# Patient Record
Sex: Female | Born: 1970 | Hispanic: Yes | Marital: Married | Smoking: Never smoker
Health system: Southern US, Community
[De-identification: ages and names within clinical notes are randomized; demographics above are authoritative.]

## PROBLEM LIST (undated history)

## (undated) DIAGNOSIS — D259 Leiomyoma of uterus, unspecified: Secondary | ICD-10-CM

## (undated) HISTORY — DX: Leiomyoma of uterus, unspecified: D25.9

## (undated) HISTORY — PX: TUBAL LIGATION: SHX77

---

## 2017-08-16 ENCOUNTER — Encounter: Payer: Self-pay | Admitting: Obstetrics and Gynecology

## 2017-08-16 ENCOUNTER — Ambulatory Visit (INDEPENDENT_AMBULATORY_CARE_PROVIDER_SITE_OTHER): Payer: Self-pay | Admitting: Obstetrics and Gynecology

## 2017-08-16 DIAGNOSIS — D219 Benign neoplasm of connective and other soft tissue, unspecified: Secondary | ICD-10-CM | POA: Insufficient documentation

## 2017-08-16 DIAGNOSIS — N920 Excessive and frequent menstruation with regular cycle: Secondary | ICD-10-CM | POA: Insufficient documentation

## 2017-08-16 MED ORDER — NORGESTIMATE-ETH ESTRADIOL 0.25-35 MG-MCG PO TABS: 1.0000 | ORAL_TABLET | Freq: Every day | ORAL | 11 refills | Status: DC

## 2017-08-16 NOTE — Progress Notes (Signed)
Patient ID: Ruth Perkins, female   DOB: 05-20-1970, 47 y.o.   MRN: [id number] Ms Ruth Perkins is referred by [facility] for evaluation for heavy cycles and uterine fibroids. Pt reports Dx with uterine fibroids 3 yrs ago in [place]. Cycles have been heavy and occurring 2 times a month until last 3 months. Was started on OCP's at [facility] 3 months ago. Cycles are now monthly and not as heavy Pap smear and Mammogram UTD C Section x 3 BTL No chronic medical problems  PE AF VSS Lungs clear Heart RRR Abd soft + BS GU Nl EGBUS uterus 12 week size non tender mobile no masses   A/P Uterine fibroids        Menorrhagia  Dx reviewed with pt. Will continue with OCP's. Pt has no insurance. Financial aid package provided to pt. Will order GYN U/S once approved for financial aid. If not approved will continue with OCP's. Also discusses possible IUD. Pt reports used IUD in [place] and did well. Pt informed if unable to get U/S she can follow up with [facility] for her OCP's of IUD placement F/U per above of PRN Interrupter used during today's visit

## 2017-08-16 NOTE — Progress Notes (Signed)
[place]

## 2017-08-16 NOTE — Patient Instructions (Signed)
Fibromas uterinos (Uterine Fibroids) Los fibromas uterinos son masas (tumores) de tejido que pueden desarrollarse en el vientre (tero). Tambin se los conoce como liomiomas. Este tipo de tumor no es canceroso (benigno) y no se disemina a otras partes del cuerpo fuera de la zona plvica, la cual se encuentra entre los huesos de la cadera. En ocasiones, los fibromas pueden crecer en las trompas de [place], en el cuello del tero o en las estructuras de soporte (ligamentos) que rodean el tero. Una mujer puede tener uno o ms fibromas. Los fibromas pueden tener diferente tamao y peso, y crecer en distintas partes del tero. Algunos pueden crecer hasta volverse bastante grandes. La mayora no requiere tratamiento mdico. CAUSAS Un fibroma puede desarrollarse cuando una nica clula uterina contina creciendo (se multiplica). La mayora de las clulas del cuerpo humano tienen un mecanismo de control que impide que se multipliquen sin control. SIGNOS Y SNTOMAS Entre los sntomas se pueden incluir los siguientes:  Hemorragias intensas durante la menstruacin.  Prdidas de sangre o hemorragias entre los perodos.  Dolor y opresin en la pelvis.  Problemas de la vejiga, como necesidad de orinar con ms frecuencia (polaquiuria) o necesidad imperiosa de orinar.  Incapacidad para reproducir (infertilidad).  Abortos espontneos. DIAGNSTICO Los fibromas uterinos se diagnostican con un examen fsico. El mdico puede palpar los tumores grumosos durante un examen plvico. Pueden realizarse ecografas y una resonancia magntica para determinar el tamao y la ubicacin de los fibromas, as como la cantidad. TRATAMIENTO El tratamiento puede incluir lo siguiente:  Observacin cautelosa. Esto requiere que el mdico controle el fibroma para saber si crece o se achica. Siga las recomendaciones del mdico respecto de la frecuencia con la que debe realizarse los controles.  Medicamentos hormonales. Pueden  tomarse por va oral o administrarse a travs de un dispositivo intrauterino (DIU).  Ciruga. ? Extirpacin de los fibromas (miomectoma) o del tero (histerectoma). ? Suprimir la irrigacin sangunea a los fibromas (embolizacin de la arteria uterina). Si los fibromas le traen problemas de fertilidad y tiene deseos de quedar embarazada, el mdico puede recomendar su extirpacin. INSTRUCCIONES PARA EL CUIDADO EN EL HOGAR  Concurra a todas las visitas de control como se lo haya indicado el mdico. Esto es importante.  Tome los medicamentos de venta libre y los recetados solamente como se lo haya indicado el mdico. ? Si le recetaron un tratamiento hormonal, tome los medicamentos hormonales exactamente como se lo indicaron.  Consulte al mdico sobre tomar comprimidos de hierro y aumentar la cantidad de verduras de hoja color verde oscuro en la dieta. Estas medidas pueden ayudar a incrementar los niveles de hierro en la sangre, que pueden verse afectados por las hemorragias menstruales intensas.  Preste mucha atencin a la menstruacin e informe al mdico si hay algn cambio, por ejemplo: ? Aumento del flujo de sangre que le exige el uso de ms compresas o tampones que los que utiliza normalmente cada mes. ? Un cambio en la cantidad de das que le dura la menstruacin cada mes. ? Un cambio en los sntomas asociados con la menstruacin, como clicos abdominales o dolor de espalda. SOLICITE ATENCIN MDICA SI:  Tiene dolor plvico, dolor de espalda o clicos abdominales que los medicamentos no pueden controlar.  Observa un aumento del sangrado entre y durante las menstruaciones.  Empapa los tampones o las compresas en el trmino de media hora o menos tiempo.  Se siente mareada, muy cansada o dbil. SOLICITE ATENCIN MDICA DE INMEDIATO SI:  Se desmaya.    El dolor plvico aumenta repentinamente. Esta informacin no tiene Marine scientist el consejo del mdico. Asegrese de hacerle al  mdico cualquier pregunta que tenga. Document Released: 01/08/2005 Document Revised: 05/02/2015 Document Reviewed: 07/07/2013 Elsevier Interactive Patient Education  2018 [place] (Menorrhagia) Se llama menorragia a los periodos menstruales abundantes o que duran ms de lo habitual. CUIDADOS EN EL HOGAR  Slo tome los medicamentos que le haya indicado el mdico.  Tome los comprimidos de hierro segn las indicaciones del mdico. El sangrado abundante puede causar bajos niveles de hierro en el organismo.  Notome aspirina desde 1 semana antes ni durante el perodo menstrual. La aspirina puede hacer que la hemorragia empeore.  Recustese un rato si debe cambiar el tampn o el apsito ms de una vez en 2 horas. Esto puede ayudar a Engineer, maintenance.  Consuma una dieta saludable y alimentos ricos en hierro. Entre estos alimentos se incluyen vegetales de [place], carne, [place], [place] y panes y Actor de grano entero.  No trate de perder peso. Espere hasta que la hemorragia se haya detenido y sus niveles de hierro vuelvan a ser normales.  SOLICITE AYUDA SI:  Empapa un tampn o un apsito cada 1 o 2 horas, y [employer] ocurre cada vez que tiene el perodo.  Necesita usar apsitos y tampones al mismo tiempo porque pierde [employer].  Debe cambiarse el apsito o el tampn durante la noche.  Tiene un perodo que dura ms de 8 das.  Elimina cogulos ms grandes que 1 pulgada (2,5 cm).  Tiene perodos irregulares que ocurren ms o menos de una vez al mes.  Se siente mareada o se desvanece (se desmaya).  Se siente muy dbil o cansada.  Le falta el aire o siente que el corazn late muy rpido al hacer [place].  Tiene ganas de vomitar ( nuseas ) y devuelve ( vomita ) mientras est tomando el medicamento.  Tiene heces acuosas (diarrea) mientras toma los medicamentos.  Tiene algn problema que pueda relacionarse con el medicamento que est  tomando.  SOLICITE AYUDA DE INMEDIATO SI:  Empapa 4 o ms apsitos o tampones en 2 horas.  Tiene sangrado y est embarazada.  ASEGRESE DE QUE:  Comprende estas instrucciones.  Controlar su afeccin.  Recibir ayuda de inmediato si no mejora o si empeora.  Esta informacin no tiene Marine scientist el consejo del mdico. Asegrese de hacerle al mdico cualquier pregunta que tenga. Document Released: 02/10/2010 Document Revised: 09/10/2012 Document Reviewed: 07/10/2012 Elsevier Interactive Patient Education  2017 [employer].

## 2019-03-04 ENCOUNTER — Ambulatory Visit: Payer: Self-pay | Attending: Family Medicine | Admitting: Family Medicine

## 2019-03-04 ENCOUNTER — Other Ambulatory Visit: Payer: Self-pay

## 2019-03-04 ENCOUNTER — Encounter: Payer: Self-pay | Admitting: Family Medicine

## 2019-03-04 DIAGNOSIS — R350 Frequency of micturition: Secondary | ICD-10-CM

## 2019-03-04 DIAGNOSIS — N941 Unspecified dyspareunia: Secondary | ICD-10-CM

## 2019-03-04 DIAGNOSIS — D259 Leiomyoma of uterus, unspecified: Secondary | ICD-10-CM

## 2019-03-04 NOTE — Progress Notes (Signed)
Pt states she feels pain when she has sexual intercourse   Pt states she is losing her hair

## 2019-03-04 NOTE — Progress Notes (Signed)
Virtual Visit via Telephone Note  I connected with Ruth Perkins on 03/04/19 at  9:50 AM EST by telephone and verified that I am speaking with the correct person using two identifiers.   I discussed the limitations, risks, security and privacy concerns of performing an evaluation and management service by telephone and the availability of in person appointments. I also discussed with the patient that there may be a patient responsible charge related to this service. The patient expressed understanding and agreed to proceed.  Patient Location: Home Provider Location: [facility] Others participating in call: call initiated by Orlan Leavens, CMA who transferred the call and obtained a Spanish speaking interpreter through [place]   History of Present Illness:          49 yo  Hispanic, non-English speaking female new to the practice who has complaint of painful intercourse and she also has to get up 2-3 times per night to urinate which she does not feel is normal for her age. She has been urinating at least once per night for about 5 years but now occurs more frequently. She has 3 children and had a tubal ligation after her last pregnancy (about 8 years ago). She did not have any gestational diabetes. About 6 years ago she was told that she had a myoma that just needed to be watched. She would also like to have a pap smear. She denies any history of abnormal pap smears. Her last pap was in 2018 in [place]. (On review of chart, she did have appointment with GYN regarding fibroids and menorrhagia and was prescribed OCP to help with bleeding).  She reports that she never started the oral contraceptives prescribed by GYN as she thought that they were prescribed for birth control and she had already had a tubal ligation.   Past Medical History:  Diagnosis Date  . Uterine myoma     Past Surgical History:  Procedure Laterality Date  . CESAREAN SECTION      Family History   Problem Relation Age of Onset  . Cancer Mother     Social History   Tobacco Use  . Smoking status: Never Smoker  . Smokeless tobacco: Never Used  Substance Use Topics  . Alcohol use: Never  . Drug use: Never     No Known Allergies     Observations/Objective: No vital signs or physical exam conducted as visit was done via telephone  Assessment and Plan: 1. Uterine leiomyoma, unspecified location Patient with uterine fibroid which is previously caused issues with heavy bleeding.  She reports that she no longer has the heavy bleeding and did not take the oral contraceptives that were prescribed by gynecology.  She is encouraged to complete application for [facility] discount program in order to have referral back to GYN in follow-up of her uterine fibroi as her note from GYN also mentions the need for pelvic ultrasound and possible placement of IUD to help with bleeding.  2. Dyspareunia in female Patient is encouraged to call back to schedule appointment for pelvic exam and Pap smear in follow-up of her complaints of pain with intercourse.  Patient will also be referred back to GYN regarding follow-up of uterine fibroids as well as her issues with painful sexual intercourse once she has completed her application for the [facility] discount program.  Discussed with patient that her pain with sexual intercourse may be related to her uterine fibroids  3. Urinary frequency She has been asked to schedule her  appointment to have physical examination as well as urinalysis to look for urinary tract infection and to check blood sugars to see if she may have elevated glucose/diabetes which may be contributing to her urinary frequency.  Follow Up Instructions:Return for Schedule office visit for physical exam/pelvic/Pap smear.    I discussed the assessment and treatment plan with the patient. The patient was provided an opportunity to ask questions and all were answered. The patient agreed with the plan and  demonstrated an understanding of the instructions.   The patient was advised to call back or seek an in-person evaluation if the symptoms worsen or if the condition fails to improve as anticipated.  I provided 15 minutes of non-face-to-face time during this encounter.   Antony Blackbird, MD

## 2019-04-03 ENCOUNTER — Ambulatory Visit: Payer: Self-pay | Admitting: Family Medicine

## 2019-04-10 ENCOUNTER — Other Ambulatory Visit: Payer: Self-pay

## 2019-04-10 ENCOUNTER — Ambulatory Visit: Payer: Self-pay | Admitting: Family Medicine

## 2019-04-10 ENCOUNTER — Ambulatory Visit: Payer: Self-pay

## 2019-04-15 ENCOUNTER — Other Ambulatory Visit: Payer: Self-pay

## 2019-04-15 ENCOUNTER — Ambulatory Visit: Payer: Self-pay | Attending: Family Medicine | Admitting: Physician Assistant

## 2019-04-15 VITALS — BP 122/76 | HR 65 | Temp 97.3°F | Resp 16 | Wt 146.4 lb

## 2019-04-15 DIAGNOSIS — N941 Unspecified dyspareunia: Secondary | ICD-10-CM

## 2019-04-15 DIAGNOSIS — R350 Frequency of micturition: Secondary | ICD-10-CM

## 2019-04-15 DIAGNOSIS — M545 Low back pain: Secondary | ICD-10-CM

## 2019-04-15 DIAGNOSIS — N750 Cyst of Bartholin's gland: Secondary | ICD-10-CM

## 2019-04-15 DIAGNOSIS — G8929 Other chronic pain: Secondary | ICD-10-CM

## 2019-04-15 DIAGNOSIS — N951 Menopausal and female climacteric states: Secondary | ICD-10-CM

## 2019-04-15 LAB — POCT URINALYSIS DIP (CLINITEK)
Bilirubin, UA: NEGATIVE
Glucose, UA: NEGATIVE mg/dL
Ketones, POC UA: NEGATIVE mg/dL
Leukocytes, UA: NEGATIVE
Nitrite, UA: NEGATIVE
POC PROTEIN,UA: NEGATIVE
Spec Grav, UA: 1.015 (ref 1.010–1.025)
Urobilinogen, UA: 0.2 E.U./dL
pH, UA: 6 (ref 5.0–8.0)

## 2019-04-15 MED ORDER — NAPROXEN 500 MG PO TABS
500.0000 mg | ORAL_TABLET | Freq: Two times a day (BID) | ORAL | 1 refills | Status: DC
Start: 2019-04-15 — End: 2021-03-13

## 2019-04-15 NOTE — Progress Notes (Signed)
Pt states she has vaginal pain wit intercourse

## 2019-04-15 NOTE — Progress Notes (Signed)
Patient ID: Ruth Perkins, female   DOB: May 10, 1970, 49 y.o.   MRN: [id number]   Ruth Perkins, is a 49 y.o. female  [id number]  [id number]  DOB - Apr 05, 1970  Subjective:  Chief Complaint and HPI: Ruth Perkins is a 49 y.o. female here today for pap  and multiple issues.  She has been having pain at the introitus at the beginning of intercourse for about 6 months.  It has not improved with using lubricants.  Periods regular-every 31 days.  Has had BTL for [facility].  Monogamous with husband  Also c/o on and off sensation of a small lump that is a little tender on the right side of her labia.    Also has lower back pain that is worse in the mornings for several months.  It gets better throughout the day and better with movement.  No radicular s/sx.  NKI.    Also c/o urinary frequency at night esp.  No dysuria.  No vaginal discharge.    ROS:   Constitutional:  No f/c, No night sweats, No unexplained weight loss. EENT:  No vision changes, No blurry vision, No hearing changes. No mouth, throat, or ear problems.  Respiratory: No cough, No SOB Cardiac: No CP, no palpitations GI:  No abd pain, No N/V/D. GU: see above Musculoskeletal: lbp Neuro: No headache, no dizziness, no motor weakness.  Skin: No rash Endocrine:  No polydipsia. No polyuria.  Psych: Denies SI/HI  No problems updated.  ALLERGIES: No Known Allergies  PAST MEDICAL HISTORY: Past Medical History:  Diagnosis Date  . Uterine myoma     MEDICATIONS AT HOME: Prior to Admission medications   Medication Sig Start Date End Date Taking? Authorizing Provider  naproxen (NAPROSYN) 500 MG tablet Take 1 tablet (500 mg total) by mouth 2 (two) times daily with a meal. Prn pain 04/15/19   Argentina Donovan, PA-C  norgestimate-ethinyl estradiol (ORTHO-CYCLEN,SPRINTEC,PREVIFEM) 0.25-35 MG-MCG tablet Take 1 tablet by mouth daily. Patient not taking: Reported on 03/04/2019 08/16/17   Chancy Milroy, MD      Objective:  EXAM:   Vitals:   04/15/19 0918  BP: 122/76  Pulse: 65  Resp: 16  Temp: (!) 97.3 F (36.3 C)  SpO2: 99%  Weight: 146 lb 6.4 oz (66.4 kg)    General appearance : A&OX3. NAD. Non-toxic-appearing HEENT: Atraumatic and Normocephalic.  PERRLA. EOM intact.   Neck: supple, no JVD. No cervical lymphadenopathy. No thyromegaly Chest/Lungs:  Breathing-non-labored, Good air entry bilaterally, breath sounds normal without rales, rhonchi, or wheezing  CVS: S1 S2 regular, no murmurs, gallops, rubs  GU:  Noted bartholin's glad on R lower labia.  Speculum inserted, small amount of vaginismus.  Cervix slightly friable.  Pap and swabs taken.  Bimanual unremarkable except mild TTP LLQ Back:  Full S&ROM.  Neg SLR B.  DTR=intact B Extremities: Bilateral Lower Ext shows no edema, both legs are warm to touch with = pulse throughout Neurology:  CN II-XII grossly intact, Non focal.   Psych:  TP linear. J/I WNL. Normal speech. Appropriate eye contact and affect.  Skin:  No Rash  Data Review No results found for: HGBA1C   Assessment & Plan   1. Perimenopausal - Cervicovaginal ancillary only - Cytology - PAP([place]) - FSH/LH - TSH - Ambulatory referral to Obstetrics / Gynecology  2. Frequent urination - Ambulatory referral to Obstetrics / Gynecology - POCT URINALYSIS DIP (CLINITEK)- UA unremarkable  3. Dyspareunia in female Lubricants, foreplay - Ambulatory referral  to Obstetrics / Gynecology  4. Bartholin gland cyst Not infected - Ambulatory referral to Obstetrics / Gynecology  5. Chronic bilateral low back pain without sciatica Likely arthritis.  Advised yoga, stretching - naproxen (NAPROSYN) 500 MG tablet; Take 1 tablet (500 mg total) by mouth 2 (two) times daily with a meal. Prn pain  Dispense: 60 tablet; Refill: 1    Spent a lot of time with patient education about perimenopausal hormonal changes, bartholin's, improving vaginal elasticity for intercourse,  lower back exercises, etc.   Patient have been counseled extensively about nutrition and exercise  Return in about 3 months (around 07/16/2019) for pcp .  The patient was given clear instructions to go to ER or return to medical center if symptoms don't improve, worsen or new problems develop. The patient verbalized understanding. The patient was told to call to get lab results if they haven't heard anything in the next week.     Freeman Caldron, PA-C [facility] [place], [place]   04/15/2019, 10:34 AM

## 2019-04-15 NOTE — Patient Instructions (Signed)
Perimenopausia Perimenopause  Se llama perimenopausia a los perodos normales de la vida que tienen lugar antes y despus del cese definitivo de los perodos menstruales ([place]). La perimenopausia puede comenzar entre 2y 8aos antes de la menopausia y suele durar 1ao despus de la menopausia. Durante la perimenopausia, los ovarios pueden o no producir vulos. Cules son las causas? La perimenopausia es causada por un cambio natural en los niveles hormonales que se produce a medida que las mujeres envejecen. Qu incrementa el riesgo? Es ms probable que la perimenopausia comience a una edad ms temprana si usted tiene Armed forces training and education officer o ha recibido ciertos tratamientos, como los siguientes:  Tumor en la hipfisis en el cerebro.  Enfermedad que [employer] ovarios y la produccin de hormonas.  Tratamiento de radiacin para Science writer.  Ciertos tratamientos para el cncer, como la quimioterapia o la terapia hormonal (antiestrgenos).  Tabaquismo o consumo de alcohol excesivos.  Antecedentes familiares de menopausia temprana. Cules son los signos o sntomas? Los cambios por la perimenopausia afectan de [place] diferente a Information systems manager. Los sntomas de esta afeccin pueden incluir los siguientes:  Nurse, learning disability.  Sudoracin nocturna.  Perodos menstruales irregulares.  Disminucin del deseo sexual.  Sequedad vaginal.  Dolores de cabeza.  Cambios en el estado de nimo.  Depresin.  Problemas de memoria o dificultad para concentrarse.  Irritabilidad.  Cansancio.  Aumento de [place].  Ansiedad.  Problemas para quedar embarazada. Cmo se diagnostica? La perimenopausia se diagnostica en funcin de los antecedentes mdicos, un examen fsico, los antecedentes relativos a la menstruacin y los sntomas. Tambin es posible que le hagan estudios hormonales. Cmo se trata? En algunos casos, no se necesita tratamiento. Usted y su mdico deben tomar juntos la decisin  respecto a si necesita tratamiento o no. El tratamiento se basar en su situacin en particular y en sus preferencias. Existen varios tratamientos disponibles, como:  Terapia hormonal para la menopausia (THM).  Medicamentos para tratar sntomas especficos.  Acupuntura.  Suplementos vitamnicos o a base de hierbas. Antes de Biochemist, clinical, asegrese de informarle a su mdico si tiene antecedentes personales o familiares de lo siguiente:  Enfermedad cardaca.  Cncer de mama.  Cogulos de [place].  Diabetes.  Osteoporosis. Siga estas indicaciones en su casa: Estilo de vida  No consuma ningn producto que contenga nicotina o tabaco, como cigarrillos y Psychologist, sport and exercise. Si necesita ayuda para dejar de fumar, consulte al mdico.  Siga una dieta equilibrada que incluya frutas y verduras frescas, cereales integrales, soja, huevos, carnes magras y lcteos descremados.  Haga al menos 30mnutos de actividad fsica, 5das por semana o ms.  Evite las bebidas alcohlicas y con cafena, as como los alimentos condimentados. Esto puede ayudar a pElectronics engineer  Duerma entre 7 y 876horas todas las noches.  Vstase con capas de prendas que pueda quitarse para cAutomotive engineer  Busque maneras de mMonsanto Company por ejemplo, realizar respiraciones profundas, mRadio broadcast assistanto escribir un diario. Instrucciones generales  Lleve un registro de los pUnited Parcel incluido lo siguiente: ? Cundo se producen. ? Cun abundantes son y cunto duran. ? Cunto tiempo transcurre entre un perodo y eBanker  Lleve un registro de los sntomas: anote cundo comienzan, con qu frecuencia los tiene y cunto duran.  Tome los medicamentos de venta libre y los recetados solamente como se lo haya indicado el mdico.  Tome los suplementos vitamnicos solamente como se lo haya indicado el mdico. Estos pueden incluir calcio, vitamina E y vitamina D.  Use  humectantes  o lubricantes vaginales para aliviar la sequedad vaginal y Teacher, English as a foreign language la comodidad durante el sexo.  Hable con el mdico antes de empezar a tomar cualquier suplemento a base de hierbas.  Concurra a todas las visitas de control como se lo haya indicado el mdico. Esto es importante. Esto incluye cualquier terapia de grupo o psicoterapia. Comunquese con un mdico si:  Tiene una hemorragia vaginal abundante o despide cogulos de sangre.  Su perodo menstrual dura ms de 2das que lo habitual.  Sus perodos menstruales se producen con Environmental health practitioner [address].  Sangra despus de [employer]. Solicite ayuda de inmediato si:  Tiene dolor en el pecho, dificultad para respirar o dificultad para hablar.  Est muy deprimida.  Siente dolor al [employer].  Siente dolor de cabeza intenso.  Tiene problemas de visin. Resumen  La perimenopausia es el perodo en el cual el cuerpo de la mujer comienza a pasar a la menopausia. Esto puede suceder naturalmente o como consecuencia de otros problemas de salud o tratamientos mdicos.  La perimenopausia puede comenzar entre 2y 8aos antes de la menopausia y suele durar 1ao despus de la menopausia.  Los sntomas de la perimenopausia pueden controlarse con medicamentos, cambios en el estilo de vida y tratamientos complementarios, como la acupuntura. Esta informacin no tiene Marine scientist el consejo del mdico. Asegrese de hacerle al mdico cualquier pregunta que tenga. Document Revised: 07/16/2016 Document Reviewed: 07/16/2016 Elsevier Patient Education  2020 Brier de Bartolino Bartholin's Cyst  Un quiste de Bartolino es un saco lleno de lquido que se forma en una glndula de [place]. Las glndulas de Bartolino son pequeas glndulas que se encuentran en los pliegues de la piel alrededor del orificio de la vagina (labios de la vulva). Este tipo de quiste forma un bulto o una hinchazn cerca del orificio inferior de  la vagina. Si tiene un quiste pequeo y no infectado, es posible que pueda tratarlo en su casa. Si el quiste se infecta, puede causar dolor y es posible que el mdico deba drenarlo. Un quiste de Bartolino infectado se denomina absceso de [place]. Siga estas indicaciones en su casa: Medicamentos  [employer] de venta libre y los recetados solamente como se lo haya indicado el mdico.  Si le recetaron un antibitico, tmelo como se lo haya indicado el mdico. No deje de tomar el antibitico aunque comience a sentirse mejor. Control del dolor y de la hinchazn  Pruebe con un bao de asiento para Therapist, occupational y la hinchazn. Un bao de asiento es un bao con agua tibia en el cual el agua solo le llega hasta la cadera y debe cubrir las nalgas. Puede tomar baos de asiento algunas veces al [employer].  Use calor en la zona afectada con la frecuencia que sea necesaria. Use la fuente de calor que el mdico le recomiende, como una compresa de calor hmedo o una almohadilla trmica. ? Coloque una [employer] piel y la fuente de Freight forwarder. ? Aplique calor durante 20 a 21minutos. ? Retire la fuente de calor si la piel se pone de color rojo brillante. Esto es muy importante si no puede sentir el dolor, el calor o el fro. Puede correr un riesgo mayor de sufrir quemaduras. Indicaciones generales  Si el quiste o absceso fue drenado: ? Siga las instrucciones del mdico en lo que respecta al cuidado de la herida. ? Utilice toallas femeninas para absorber el lquido.  No apriete ni  haga presin [employer].  No tenga relaciones sexuales hasta que el quiste haya desaparecido o la herida del drenaje haya sanado.  Tome estas medidas para ayudar a prevenir que vuelva a formarse un quiste de Engineer, manufacturing y que se formen otros quistes de Bartolino: ? Building services engineer un bao o una ducha Engineer, water. Higienice el rea vaginal con agua y un jabn suave cuando se bae. ? Practique sexo seguro para prevenir las ITS  (infecciones de transmisin sexual). Hable con el mdico sobre cmo prevenir las ITS y qu formas de mtodos de control de la natalidad (anticonceptivos) pueden ser mejores para usted.  Concurra a todas las visitas de control como se lo haya indicado el mdico. Esto es importante. Comunquese con un mdico si:  Tiene fiebre.  Tiene enrojecimiento, hinchazn o dolor alrededor del quiste.  Tiene lquido, sangre, pus o mal olor que proviene del [place].  Tiene un quiste que [place] de tamao o vuelve a formarse. Resumen  Un quiste de Bartolino es un saco lleno de lquido que se forma en una glndula de [place]. Estas pequeas glndulas se encuentran en los pliegues de la piel alrededor del orificio de la vagina (labios de la vulva).  Este tipo de quiste forma un bulto o una hinchazn cerca del orificio inferior de la vagina. Un quiste de Bartolino infectado se denomina absceso de [place].  Pruebe con baos de asiento algunas veces al da para ayudar a Therapist, occupational y la hinchazn.  No apriete ni haga presin [employer]. Esta informacin no tiene Marine scientist el consejo del mdico. Asegrese de hacerle al mdico cualquier pregunta que tenga. Document Revised: 12/03/2017 Document Reviewed: 12/03/2017 Elsevier Patient Education  2020 [employer].

## 2019-04-16 LAB — CYTOLOGY - PAP
Adequacy: ABSENT
Diagnosis: NEGATIVE

## 2019-04-16 LAB — CERVICOVAGINAL ANCILLARY ONLY
Bacterial Vaginitis (gardnerella): NEGATIVE
Candida Glabrata: NEGATIVE
Candida Vaginitis: NEGATIVE
Chlamydia: NEGATIVE
Comment: NEGATIVE
Comment: NEGATIVE
Comment: NEGATIVE
Comment: NEGATIVE
Comment: NEGATIVE
Comment: NORMAL
Neisseria Gonorrhea: NEGATIVE
Trichomonas: NEGATIVE

## 2019-04-16 LAB — FSH/LH
FSH: 7.7 m[IU]/mL
LH: 9.5 m[IU]/mL

## 2019-04-16 LAB — TSH: TSH: 1.82 u[IU]/mL (ref 0.450–4.500)

## 2019-05-28 ENCOUNTER — Ambulatory Visit: Payer: Self-pay | Admitting: Family Medicine

## 2019-06-10 ENCOUNTER — Other Ambulatory Visit: Payer: Self-pay | Admitting: Family Medicine

## 2019-06-10 ENCOUNTER — Telehealth: Payer: Self-pay | Admitting: Family Medicine

## 2019-06-10 DIAGNOSIS — K0859 Other unsatisfactory restoration of tooth: Secondary | ICD-10-CM

## 2019-06-10 NOTE — Progress Notes (Signed)
Patient ID: Ruth Perkins, female   DOB: 12-Apr-1970, 49 y.o.   MRN: [id number]   Patient left message requesting dental referral as she recently lost a filling in the tooth

## 2019-06-10 NOTE — Telephone Encounter (Signed)
Notify patient that dental referral was placed

## 2019-06-10 NOTE — Telephone Encounter (Signed)
Patient called requesting a dental referral. Patient states that one of her fillings came out. Please f/u.

## 2019-06-12 NOTE — Telephone Encounter (Signed)
Informed patient with what provider stated and she verbalized understanding.  

## 2019-06-26 ENCOUNTER — Encounter: Payer: Self-pay | Admitting: Family Medicine

## 2019-06-26 ENCOUNTER — Other Ambulatory Visit: Payer: Self-pay

## 2019-06-26 ENCOUNTER — Ambulatory Visit (INDEPENDENT_AMBULATORY_CARE_PROVIDER_SITE_OTHER): Payer: Self-pay | Admitting: Family Medicine

## 2019-06-26 VITALS — BP 118/58 | HR 69 | Wt 144.5 lb

## 2019-06-26 DIAGNOSIS — D219 Benign neoplasm of connective and other soft tissue, unspecified: Secondary | ICD-10-CM

## 2019-06-26 DIAGNOSIS — R6882 Decreased libido: Secondary | ICD-10-CM

## 2019-06-26 DIAGNOSIS — N941 Unspecified dyspareunia: Secondary | ICD-10-CM

## 2019-06-26 DIAGNOSIS — N920 Excessive and frequent menstruation with regular cycle: Secondary | ICD-10-CM

## 2019-06-26 MED ORDER — NORGESTIMATE-ETH ESTRADIOL 0.25-35 MG-MCG PO TABS: 1.0000 | ORAL_TABLET | Freq: Every day | ORAL | 11 refills | Status: DC

## 2019-06-26 NOTE — Progress Notes (Signed)
Patient is here because she went to another clinic and got a pap smear and everything was fine and they states she needed and ultrasound because she has pain and burning during intercourse wet prep complete and everything was negative and she doesn't feel like doing anything sexually. Everyday she wakes up her lower back and waist is stiff. Also  Has a stiff middle finger when she wakes up it takes a few hours to be better. Sometimes during the day she feels stabbing part in her middle finger.    Found cysts 3 years ago in [place]  Has the orange card from [facility] .

## 2019-06-26 NOTE — Progress Notes (Signed)
   Subjective:    Patient ID: Ruth Perkins, female    DOB: [birth year], 49 y.o.   MRN: [id number]  HPI 49 yo [id number] referred here for dysparunea and decreased libido. Started 7-8 months ago. Uses gel during sex, but still burns. No abnormal vaginal discharge. Regular menses. Interest in sex has diminished over the past several months. Thinks that its partially due to the burning she gets, but also just generalized decreased interest.   Had [place] in past, showing fibroids. First 2 days heavy, then spots for 5 days.   Thinks mother had uterine cancer. Mother had hysterectomy for this. No family history of breast cancer. Does not smoke. No history of migraines.   Review of Systems     Objective:   Physical Exam Vitals reviewed. Exam conducted with a chaperone present.  Constitutional:      Appearance: Normal appearance.  HENT:     Head: Normocephalic and atraumatic.  Abdominal:     Hernia: There is no hernia in the left inguinal area or right inguinal area.  Genitourinary:    Labia:        Right: No rash, tenderness, lesion or injury.        Left: No rash, tenderness or lesion.      Vagina: No signs of injury and foreign body. No vaginal discharge, erythema, tenderness or bleeding.     Cervix: No cervical motion tenderness, discharge, friability, lesion, erythema or cervical bleeding.    Lymphadenopathy:     Lower Body: No right inguinal adenopathy. No left inguinal adenopathy.  Neurological:     Mental Status: She is alert.       Assessment & Plan:  1. Low libido Multifactorial - partial due to perimenopausal status, combined with dyspareunia due to vaginal atrophy. Discussed trial of COCs. Patient is low risk: BP normal, no CAD, nonsmoker. This may also help with menorrhagia and vaginal atrophy. Discussed risk of blood clot - see medical care emergently if develops.  2. Fibroids Check [place].  3. Menorrhagia with regular cycle   4. Dyspareunia in female Secondary to  vaginal atrophy. Trial COCs to see if this improves.

## 2019-07-23 ENCOUNTER — Ambulatory Visit
Admission: RE | Admit: 2019-07-23 | Discharge: 2019-07-23 | Disposition: A | Discharge status: Home / Self Care | Payer: Self-pay | Source: Ambulatory Visit | Attending: Family Medicine | Admitting: Family Medicine

## 2019-07-23 ENCOUNTER — Other Ambulatory Visit: Payer: Self-pay

## 2019-07-23 DIAGNOSIS — N920 Excessive and frequent menstruation with regular cycle: Secondary | ICD-10-CM

## 2019-07-23 DIAGNOSIS — D219 Benign neoplasm of connective and other soft tissue, unspecified: Secondary | ICD-10-CM

## 2019-07-23 DIAGNOSIS — Z1231 Encounter for screening mammogram for malignant neoplasm of breast: Secondary | ICD-10-CM

## 2019-07-30 ENCOUNTER — Telehealth: Payer: Self-pay | Admitting: *Deleted

## 2019-07-30 NOTE — Telephone Encounter (Signed)
I called Ruth Perkins with [facility] and informed her of results per Dr. Nehemiah Settle. She informed me she has not yet had a period , was due 07/28/19. I informed her hopefully when she does get it; it will be lighter. She asked if she should still get pap smears and I informed her yes. She already has appointment with [facility]. She voices understanding. Dodge Ator,RN

## 2019-07-30 NOTE — Telephone Encounter (Signed)
-----   Message from Truett Mainland, DO sent at 07/30/2019  2:39 PM EDT ----- Please let pt know that her US showed a small fibroid, but otherwise fairly normal. Hopefully, the birth control is helping her menses to be lighter.

## 2019-08-13 ENCOUNTER — Ambulatory Visit: Payer: Self-pay | Admitting: *Deleted

## 2019-08-13 ENCOUNTER — Other Ambulatory Visit: Payer: Self-pay | Admitting: Obstetrics and Gynecology

## 2019-08-13 ENCOUNTER — Other Ambulatory Visit: Payer: Self-pay

## 2019-08-13 ENCOUNTER — Ambulatory Visit
Admission: RE | Admit: 2019-08-13 | Discharge: 2019-08-13 | Disposition: A | Discharge status: Home / Self Care | Payer: No Typology Code available for payment source | Source: Ambulatory Visit | Attending: Family Medicine | Admitting: Family Medicine

## 2019-08-13 VITALS — BP 110/60 | Temp 97.8°F | Wt 147.0 lb

## 2019-08-13 DIAGNOSIS — Z1239 Encounter for other screening for malignant neoplasm of breast: Secondary | ICD-10-CM

## 2019-08-13 DIAGNOSIS — Z1231 Encounter for screening mammogram for malignant neoplasm of breast: Secondary | ICD-10-CM

## 2019-08-13 NOTE — Patient Instructions (Addendum)
Informed Ruth Perkins about breast self awareness. Patient did not need a Pap smear today due to last Pap smear was on 04/15/2019 per patient. Let her know [facility] will cover Pap smears every 3 years unless has a history of abnormal Pap smears. Referred patient to the [facility] for a screening mammogram on the mobile unit. Appointment scheduled August 13, 2019 at 3:50pm. Patient aware of appointment and will be there. Let patient know the [facility] will follow up with her within the next couple weeks with results of her mammogram by letter or phone. Ruth Perkins verbalized understanding.  Vania Rea, RN, FNP student 3:44 PM

## 2019-08-13 NOTE — Progress Notes (Signed)
Ms. Ruth Perkins is a 49 y.o. female who presents to [facility] clinic today with no complaints.    Pap Smear: Pap smear not completed today. Last Pap smear was 04/15/2019 at [facility] and was normal. HPV testing was not performed. Per patient has no history of an abnormal Pap smear. Last Pap smear result is available in Epic.   Physical exam: Breasts Breasts symmetrical. No skin abnormalities bilateral breasts. No nipple retraction bilateral breasts. No nipple discharge bilateral breasts. No lymphadenopathy. No lumps palpated bilateral breasts. Patient has breast implants. No complaints of pain or tenderness on exam.     Pelvic/Bimanual Pap is not indicated today.    Smoking History: Patient has never smoked.   Patient Navigation: Patient education provided. Access to services provided for patient through [facility] program. Rudene Anda, [place] interpreter provided through [facility].   Breast and Cervical Cancer Risk Assessment: Patient does not have family history of breast cancer, known genetic mutations, or radiation treatment to the chest before age 33. Patient does not have history of cervical dysplasia, immunocompromised, or DES exposure in-utero.  Risk Assessment    Risk Scores      08/13/2019   Last edited by: Demetrius Revel, LPN   5-year risk: 0.7 %   Lifetime risk: 7.3 %          A: [facility] exam without pap smear No complaints today.  P: Referred patient to the [facility] for a screening mammogram on the mobile unit. Appointment scheduled August 13, 2019 at 3:50pm.  Vania Rea, RN, FNP student 08/13/2019 3:41 PM   Attestation of Supervision of Student:  I confirm that I have verified the information documented in the nurse practitioner student's note and that I have also personally reperformed the history, physical exam and all medical decision making activities.  I have verified that all services and findings are accurately documented in this  student's note; and I agree with management and plan as outlined in the documentation. I have also made any necessary editorial changes.  [place], [place] for [employer], [employer] [employer] 08/13/2019 4:32 PM

## 2020-05-16 NOTE — Progress Notes (Deleted)
    Patient ID: Ruth Perkins, female    DOB: 09/04/1970  MRN: [id number]  CC: Mammogram Referral   Subjective: Ruth Perkins is a 50 y.o. female who presents for mammogram referral. Her concerns today include:   Patient Active Problem List   Diagnosis Date Noted  . Fibroids 08/16/2017  . Menorrhagia 08/16/2017     Current Outpatient Medications on File Prior to Visit  Medication Sig Dispense Refill  . naproxen (NAPROSYN) 500 MG tablet Take 1 tablet (500 mg total) by mouth 2 (two) times daily with a meal. Prn pain (Patient not taking: Reported on 06/26/2019) 60 tablet 1  . norgestimate-ethinyl estradiol (ORTHO-CYCLEN) 0.25-35 MG-MCG tablet Take 1 tablet by mouth daily. 1 Package 11   No current facility-administered medications on file prior to visit.    No Known Allergies  Social History   Socioeconomic History  . Marital status: Divorced    Spouse name: Not on file  . Number of children: 3  . Years of education: Not on file  . Highest education level: High school graduate  Occupational History  . Not on file  Tobacco Use  . Smoking status: Never Smoker  . Smokeless tobacco: Never Used  Vaping Use  . Vaping Use: Never used  Substance and Sexual Activity  . Alcohol use: Never  . Drug use: Never  . Sexual activity: Yes    Birth control/protection: Pill  Other Topics Concern  . Not on file  Social History Narrative  . Not on file   Social Determinants of Health   Financial Resource Strain: Not on file  Food Insecurity: Not on file  Transportation Needs: No Transportation Needs  . Lack of Transportation (Medical): No  . Lack of Transportation (Non-Medical): No  Physical Activity: Not on file  Stress: Not on file  Social Connections: Not on file  Intimate Partner Violence: Not on file    Family History  Problem Relation Age of Onset  . Uterine cancer Mother     Past Surgical History:  Procedure Laterality Date  . CESAREAN SECTION    .  TUBAL LIGATION      ROS: Review of Systems Negative except as stated above  PHYSICAL EXAM: There were no vitals taken for this visit.  Physical Exam  {female adult master:310786} {female adult master:310785}  No flowsheet data found. Lipid Panel  No results found for: CHOL, TRIG, HDL, CHOLHDL, VLDL, LDLCALC, LDLDIRECT  CBC No results found for: WBC, RBC, HGB, HCT, PLT, MCV, MCH, MCHC, RDW, LYMPHSABS, MONOABS, EOSABS, BASOSABS  ASSESSMENT AND PLAN:  There are no diagnoses linked to this encounter.   Patient was given the opportunity to ask questions.  Patient verbalized understanding of the plan and was able to repeat key elements of the plan. Patient was given clear instructions to go to Emergency Department or return to medical center if symptoms don't improve, worsen, or new problems develop.The patient verbalized understanding.   No orders of the defined types were placed in this encounter.    Requested Prescriptions    No prescriptions requested or ordered in this encounter    No follow-ups on file.  [name], NP

## 2020-05-17 ENCOUNTER — Ambulatory Visit: Payer: No Typology Code available for payment source | Admitting: Family

## 2020-07-11 NOTE — Progress Notes (Deleted)
Virtual Visit via Telephone Note  I connected with Ruth Perkins, on 07/11/2020 at 8:28 AM by telephone due to the COVID-19 pandemic and verified that I am speaking with the correct person using two identifiers.  Due to current restrictions/limitations of in-office visits due to the COVID-19 pandemic, this scheduled clinical appointment was converted to a telehealth visit.   Consent: I discussed the limitations, risks, security and privacy concerns of performing an evaluation and management service by telephone and the availability of in person appointments. I also discussed with the patient that there may be a patient responsible charge related to this service. The patient expressed understanding and agreed to proceed.   Location of Patient: Home  Location of Provider: [facility] participating in Telemedicine visit: Kenyatte Gruber Aguilera Durene Fruits, NP Elmon Else, CMA   History of Present Illness: Ruth Perkins is a 50 year-old female who presents to establish care. PMH significant for fibroids and menorrhagia.   Current issues and/or concerns:    Past Medical History:  Diagnosis Date   Uterine myoma    No Known Allergies  Current Outpatient Medications on File Prior to Visit  Medication Sig Dispense Refill   naproxen (NAPROSYN) 500 MG tablet Take 1 tablet (500 mg total) by mouth 2 (two) times daily with a meal. Prn pain (Patient not taking: Reported on 06/26/2019) 60 tablet 1   norgestimate-ethinyl estradiol (ORTHO-CYCLEN) 0.25-35 MG-MCG tablet Take 1 tablet by mouth daily. 1 Package 11   No current facility-administered medications on file prior to visit.    Observations/Objective: Alert and oriented x 3. Not in acute distress. Physical examination not completed as this is a telemedicine visit.  Assessment and Plan:   Follow Up Instructions:    Patient was given clear instructions to go to Emergency  Department or return to medical center if symptoms don't improve, worsen, or new problems develop.The patient verbalized understanding.  I discussed the assessment and treatment plan with the patient. The patient was provided an opportunity to ask questions and all were answered. The patient agreed with the plan and demonstrated an understanding of the instructions.   The patient was advised to call back or seek an in-person evaluation if the symptoms worsen or if the condition fails to improve as anticipated.     I provided *** minutes total of non-face-to-face time during this encounter.   [name], NP  [facility] [place], [place] 07/11/2020, 8:28 AM

## 2020-07-12 ENCOUNTER — Telehealth: Payer: No Typology Code available for payment source | Admitting: Family

## 2020-11-21 ENCOUNTER — Other Ambulatory Visit: Payer: Self-pay

## 2020-11-21 DIAGNOSIS — Z1231 Encounter for screening mammogram for malignant neoplasm of breast: Secondary | ICD-10-CM

## 2021-01-27 NOTE — Addendum Note (Signed)
Addended by: Demetrius Revel on: 01/27/2021 11:28 AM   Modules accepted: Orders

## 2021-02-02 ENCOUNTER — Encounter (INDEPENDENT_AMBULATORY_CARE_PROVIDER_SITE_OTHER): Payer: Self-pay

## 2021-02-02 ENCOUNTER — Other Ambulatory Visit: Payer: Self-pay

## 2021-02-02 ENCOUNTER — Ambulatory Visit
Admission: RE | Admit: 2021-02-02 | Discharge: 2021-02-02 | Disposition: A | Discharge status: Home / Self Care | Payer: No Typology Code available for payment source | Source: Ambulatory Visit | Attending: Obstetrics and Gynecology | Admitting: Obstetrics and Gynecology

## 2021-02-02 ENCOUNTER — Ambulatory Visit: Payer: Self-pay | Admitting: *Deleted

## 2021-02-02 VITALS — BP 118/70 | Wt 142.2 lb

## 2021-02-02 DIAGNOSIS — Z1231 Encounter for screening mammogram for malignant neoplasm of breast: Secondary | ICD-10-CM

## 2021-02-02 DIAGNOSIS — Z1211 Encounter for screening for malignant neoplasm of colon: Secondary | ICD-10-CM

## 2021-02-02 DIAGNOSIS — Z1239 Encounter for other screening for malignant neoplasm of breast: Secondary | ICD-10-CM

## 2021-02-02 NOTE — Patient Instructions (Signed)
Explained breast self awareness with Ruth Perkins. Patient did not need a Pap smear today due to last Pap smear and HPV typing was 04/15/2019. Let her know [facility] will cover Pap smears and HPV typing every 5 years unless has a history of abnormal Pap smears. Referred patient to the [facility] for a screening mammogram on mobile unit. Appointment scheduled Thursday, February 02, 2021 at 1100. Patient escorted to the mobile unit following [facility] appointment for her screening mammogram. Let patient know the Breast Center will follow up with her within the next couple weeks with results of her mammogram by letter or phone. Ruth Perkins verbalized understanding.  Ruth Perkins, Arvil Chaco, RN 10:22 AM

## 2021-02-02 NOTE — Progress Notes (Signed)
Ruth Perkins is a 51 y.o. female who presents to [facility] clinic today with no complaints.    Pap Smear: Pap smear not completed today. Last Pap smear was 04/15/2019 at [facility] and was normal. Per patient has no history of an abnormal Pap smear. Last Pap smear result is available in Epic.   Physical exam: Breasts Breasts symmetrical. Patient has breast implants. No skin abnormalities bilateral breasts. No nipple retraction bilateral breasts. No nipple discharge bilateral breasts. No lymphadenopathy. No lumps palpated bilateral breasts. No complaints of pain or tenderness on exam.  MS Digital Screening W/ Implants  Result Date: 08/17/2019 CLINICAL DATA:  Screening. EXAM: DIGITAL SCREENING BILATERAL MAMMOGRAM WITH IMPLANTS AND CAD The patient has implants. Standard and implant displaced views were performed. COMPARISON:  None. ACR Breast Density Category c: The breast tissue is heterogeneously dense, which may obscure small masses. FINDINGS: There are no findings suspicious for malignancy. Images were processed with CAD. IMPRESSION: No mammographic evidence of malignancy. A result letter of this screening mammogram will be mailed directly to the patient. RECOMMENDATION: Screening mammogram in one year. (Code:SM-B-01Y) BI-RADS CATEGORY  1:  Negative. Electronically Signed   By: Kristopher Oppenheim M.D.   On: 08/17/2019 07:59         Pelvic/Bimanual Pap is not indicated today per BCCCP guidelines.   Smoking History: Patient has never smoked.   Patient Navigation: Patient education provided. Access to services provided for patient through [place] program. Spanish interpreter Rudene Anda from [facility] provided.   Colorectal Cancer Screening: Per patient has never had colonoscopy completed. FIT Test given to patient to complete. No complaints today.    Breast and Cervical Cancer Risk Assessment: Patient does not have family history of breast cancer, known  genetic mutations, or radiation treatment to the chest before age 53. Patient does not have history of cervical dysplasia, immunocompromised, or DES exposure in-utero.  Risk Assessment     Risk Scores       02/02/2021 08/13/2019   Last edited by: Demetrius Revel, LPN McGill, Sherie Mamie Nick, LPN   5-year risk: 0.7 % 0.7 %   Lifetime risk: 7.2 % 7.3 %            A: BCCCP exam without pap smear No complaints.  P: Referred patient to the [facility] for a screening mammogram on mobile unit. Appointment scheduled Thursday, February 02, 2021 at 1100.  Loletta Parish, RN 02/02/2021 10:22 AM

## 2021-02-07 ENCOUNTER — Other Ambulatory Visit: Payer: Self-pay | Admitting: Obstetrics and Gynecology

## 2021-02-07 DIAGNOSIS — R928 Other abnormal and inconclusive findings on diagnostic imaging of breast: Secondary | ICD-10-CM

## 2021-03-07 ENCOUNTER — Other Ambulatory Visit: Payer: Self-pay | Admitting: Obstetrics and Gynecology

## 2021-03-07 ENCOUNTER — Ambulatory Visit
Admission: RE | Admit: 2021-03-07 | Discharge: 2021-03-07 | Disposition: A | Discharge status: Home / Self Care | Payer: No Typology Code available for payment source | Source: Ambulatory Visit | Attending: Obstetrics and Gynecology | Admitting: Obstetrics and Gynecology

## 2021-03-07 DIAGNOSIS — R928 Other abnormal and inconclusive findings on diagnostic imaging of breast: Secondary | ICD-10-CM

## 2021-03-07 DIAGNOSIS — N6310 Unspecified lump in the right breast, unspecified quadrant: Secondary | ICD-10-CM

## 2021-03-09 ENCOUNTER — Telehealth: Payer: Self-pay

## 2021-03-09 NOTE — Telephone Encounter (Signed)
Spoke with patient via Rudene Anda, Erling Cruz about breast MRI. Informed patient that BCCCP will not cover MRI and will not be able to order the MRI. Patient will need to re-establish care with PCP. Gave patient contact information for [facility] where patient has gone in the past. Told patient that the provider she sees will need to order the MRI. Patient will call [facility] to see what the cost would be for the MRI. Informed patient about the importance of having MRI completed to determine if her left breast implant has ruptured. Patient voiced understanding.

## 2021-03-13 ENCOUNTER — Other Ambulatory Visit: Payer: Self-pay

## 2021-03-13 ENCOUNTER — Ambulatory Visit: Payer: Self-pay | Attending: Critical Care Medicine | Admitting: Critical Care Medicine

## 2021-03-13 ENCOUNTER — Encounter: Payer: Self-pay | Admitting: Critical Care Medicine

## 2021-03-13 VITALS — BP 117/77 | HR 65 | Resp 16 | Wt 144.4 lb

## 2021-03-13 DIAGNOSIS — N921 Excessive and frequent menstruation with irregular cycle: Secondary | ICD-10-CM

## 2021-03-13 DIAGNOSIS — Z1159 Encounter for screening for other viral diseases: Secondary | ICD-10-CM

## 2021-03-13 DIAGNOSIS — D219 Benign neoplasm of connective and other soft tissue, unspecified: Secondary | ICD-10-CM

## 2021-03-13 DIAGNOSIS — Z1211 Encounter for screening for malignant neoplasm of colon: Secondary | ICD-10-CM

## 2021-03-13 DIAGNOSIS — Z114 Encounter for screening for human immunodeficiency virus [HIV]: Secondary | ICD-10-CM

## 2021-03-13 DIAGNOSIS — Z139 Encounter for screening, unspecified: Secondary | ICD-10-CM

## 2021-03-13 DIAGNOSIS — T8543XA Leakage of breast prosthesis and implant, initial encounter: Secondary | ICD-10-CM

## 2021-03-13 NOTE — Patient Instructions (Addendum)
Get orange card application from front desk , once you are approved for orange card then we can then get an MRI performed of the breast  Complete health screening labs will be obtained  A colon cancer screening exam will be obtained  We will call you the lab results  Perform back exercises as outlined on attachment   Return to Dr Joya Gaskins in 3 months  Obtenga la solicitud de tarjeta naranja en la recepcin, una vez que haya sido aprobada para la tarjeta naranja, entonces podemos realizar una resonancia magntica del seno  Se obtendrn laboratorios de evaluacin de salud completos.  Se obtendr un examen de deteccin de cncer de colon.  Te llamaremos los resultados de laboratorio.  Realice ejercicios de espalda como se describe en el archivo adjunto  Regreso al Dr. Joya Gaskins en 3 meses

## 2021-03-13 NOTE — Assessment & Plan Note (Signed)
Bleeding has substantially reduced she is now off birth control pills we will simply monitor now obtain blood counts

## 2021-03-13 NOTE — Progress Notes (Signed)
New Patient Office Visit  Subjective:  Patient ID: Ruth Perkins, female    DOB: 20-Jun-1970  Age: 51 y.o. MRN: [id number]  CC:  Chief Complaint  Patient presents with   Breast Problem    HPI Ruth Perkins presents for pcp to re establish This visit was assisted by Spanish video interpreter Aram Beecham [phone] Patient is a former primary care patient of Dr. Chapman Fitch not seen since early of 2021 in this clinic.  Patient has had screening mammogram through the breast cancer scholarship program.  She had previously had bilateral silicone implants placed 16 years ago in [place].  Because of this she had to have special tomographic mammograms done.  There are small cysts in the right breast that are being followed however she may have leakage of the silicone in the left breast.  She is noted for the past several months that she has had pain in the left breast.  She also has incidentally left sided chest wall and upper arm shoulder and neck pain as well which is not associated to the chest pain from the breast.  She is interested in having the silicone implants removed if this needs to be the case.  She does not have any insurance at this time.  She did have the orange card but this is expired Another issue is this patient had previously developed fibroid uterus and had dysmenorrhea.  As she is approaching menopause the amount of blood now is less.  She took birth control pills for a while but has a bleeding decrease in her periods became less regular she stopped the birth control pills.  She is using a watchful waiting pattern at this time.  She does need repeat screening labs. The patient declined to receive tetanus shot and flu shot  She is agreeable to receiving colon cancer screening kit  Past Medical History:  Diagnosis Date   Uterine myoma     Past Surgical History:  Procedure Laterality Date   CESAREAN SECTION     TUBAL LIGATION      Family History  Problem Relation Age of  Onset   Uterine cancer Mother     Social History   Socioeconomic History   Marital status: Divorced    Spouse name: Not on file   Number of children: 3   Years of education: Not on file   Highest education level: High school graduate  Occupational History   Not on file  Tobacco Use   Smoking status: Never   Smokeless tobacco: Never  Vaping Use   Vaping Use: Never used  Substance and Sexual Activity   Alcohol use: Never   Drug use: Never   Sexual activity: Yes    Birth control/protection: Surgical  Other Topics Concern   Not on file  Social History Narrative   Not on file   Social Determinants of Health   Financial Resource Strain: Not on file  Food Insecurity: No Food Insecurity   Worried About Running Out of Food in the Last Year: Never true   [place] in the Last Year: Never true  Transportation Needs: No Transportation Needs   Lack of Transportation (Medical): No   Lack of Transportation (Non-Medical): No  Physical Activity: Not on file  Stress: Not on file  Social Connections: Not on file  Intimate Partner Violence: Not on file    ROS Review of Systems  Constitutional:  Negative for chills, diaphoresis and fever.  HENT:  Negative for congestion,  hearing loss, nosebleeds, sore throat and tinnitus.   Eyes:  Negative for photophobia and redness.  Respiratory:  Negative for cough, shortness of breath, wheezing and stridor.   Cardiovascular:  Negative for chest pain, palpitations and leg swelling.  Gastrointestinal:  Negative for abdominal pain, blood in stool, constipation, diarrhea, nausea and vomiting.  Endocrine: Negative for polydipsia.  Genitourinary:  Positive for pelvic pain. Negative for dysuria, flank pain, frequency, hematuria and urgency.       Frequent periods   excess bleeding  Musculoskeletal:  Positive for back pain and neck pain. Negative for myalgias.       Left breast pain  Skin:  Negative for rash.  Allergic/Immunologic: Negative for  environmental allergies.  Neurological:  Negative for dizziness, tremors, seizures, weakness and headaches.  Hematological:  Does not bruise/bleed easily.  Psychiatric/Behavioral:  Negative for suicidal ideas. The patient is not nervous/anxious.    Objective:   Today's Vitals: BP 117/77    Pulse 65    Resp 16    Wt 144 lb 6.4 oz (65.5 kg)    LMP 02/25/2021 Comment: tubes tied   SpO2 100%    BMI 23.31 kg/m   Physical Exam Vitals reviewed.  Constitutional:      Appearance: Normal appearance. She is well-developed. She is not diaphoretic.  HENT:     Head: Normocephalic and atraumatic.     Nose: Nose normal. No nasal deformity, septal deviation, mucosal edema or rhinorrhea.     Right Sinus: No maxillary sinus tenderness or frontal sinus tenderness.     Left Sinus: No maxillary sinus tenderness or frontal sinus tenderness.     Mouth/Throat:     Mouth: Mucous membranes are moist.     Pharynx: Oropharynx is clear. No oropharyngeal exudate.  Eyes:     General: No scleral icterus.    Conjunctiva/sclera: Conjunctivae normal.     Pupils: Pupils are equal, round, and reactive to light.  Neck:     Thyroid: No thyromegaly.     Vascular: No carotid bruit or JVD.     Trachea: Trachea normal. No tracheal tenderness or tracheal deviation.  Cardiovascular:     Rate and Rhythm: Normal rate and regular rhythm.     Chest Wall: PMI is not displaced.     Pulses: Normal pulses. No decreased pulses.     Heart sounds: Normal heart sounds, S1 normal and S2 normal. Heart sounds not distant. No murmur heard. No systolic murmur is present.  No diastolic murmur is present.    No friction rub. No gallop. No S3 or S4 sounds.  Pulmonary:     Effort: Pulmonary effort is normal. No tachypnea, accessory muscle usage or respiratory distress.     Breath sounds: Normal breath sounds. No stridor. No decreased breath sounds, wheezing, rhonchi or rales.  Chest:     Chest wall: No tenderness.     Comments: There are  bilateral breast silicone implants palpable however the one on the left appears to be more firm and is more tender at 6:00 but difficult to tell if there is a significant amount of silicone leakage based on the physical exam Abdominal:     General: Bowel sounds are normal. There is no distension.     Palpations: Abdomen is soft. Abdomen is not rigid.     Tenderness: There is no abdominal tenderness. There is no guarding or rebound.  Musculoskeletal:        General: Normal range of motion.     Cervical  back: Normal range of motion and neck supple. No edema, erythema or rigidity. No muscular tenderness. Normal range of motion.  Lymphadenopathy:     Head:     Right side of head: No submental or submandibular adenopathy.     Left side of head: No submental or submandibular adenopathy.     Cervical: No cervical adenopathy.  Skin:    General: Skin is warm and dry.     Coloration: Skin is not pale.     Findings: No rash.     Nails: There is no clubbing.  Neurological:     Mental Status: She is alert and oriented to person, place, and time.     Sensory: No sensory deficit.  Psychiatric:        Speech: Speech normal.        Behavior: Behavior normal.     Breast Mammogram 03/07/21 FINDINGS: Spot compression tomosynthesis images through the upper outer right breast demonstrates a lobulated obscured mass measuring approximately 1.1 cm. The patient has retropectoral implants.   Ultrasound targeted to the right breast at 11 o'clock, 2 cm from the nipple demonstrates a cluster of anechoic and hypoechoic masses spanning 1.0 x 0.4 x 0.3 cm. A portion of this is cystic while other portions are hypoechoic, and favored to represent complicated cysts.   IMPRESSION: 1. There is a likely benign 1.0 cm cluster of cysts in the right breast at 11 o'clock.   2. Upon review of the patient's screening mammogram, appears she has extracapsular silicone along the medial posterior aspect of the capsule  suggestive of extracapsular rupture.   RECOMMENDATION: 1. Six-month follow-up right breast ultrasound is recommended for the likely benign mass at 11 o'clock.   2. I explained the possible silicone rupture in the medial left breast, and that MRI with a implant rupture protocol is the gold standard for evaluating silicone implant rupture. Assessment & Plan:   Problem List Items Addressed This Visit       Other   Fibroids    Patient with chronic pelvic pain but not severe enough to require hysterectomy we will monitor      Menorrhagia    Bleeding has substantially reduced she is now off birth control pills we will simply monitor now obtain blood counts      Relevant Orders   CBC with Differential/Platelet   Ruptured silicone breast implant    Based on symptoms and recent imaging the patient likely has leakage of the left silicone implant  We will need to investigate this further the best study is an MRI however she needs to get [place] discount and orange card before we can schedule this due to cost  Plan is to get the orange card application performed ASAP and then we will get her scheduled with an MRI        Colon cancer screening    Issue fecal occult kit for screenings      Relevant Orders   Fecal occult blood, imunochemical   Encounter for health-related screening    We will obtain full screening labs including HIV hep C      Relevant Orders   Comprehensive metabolic panel   Hemoglobin A1c   Lipid panel   Other Visit Diagnoses     Encounter for screening for HIV    -  Primary   Relevant Orders   HIV Antibody (routine testing w rflx)   Need for hepatitis C screening test       Relevant  Orders   HCV Ab w Reflex to Quant PCR       Outpatient Encounter Medications as of 03/13/2021  Medication Sig   cholecalciferol (VITAMIN D3) 25 MCG (1000 UNIT) tablet Take 1,000 Units by mouth daily.   [DISCONTINUED] naproxen (NAPROSYN) 500 MG tablet Take 1 tablet  (500 mg total) by mouth 2 (two) times daily with a meal. Prn pain (Patient not taking: Reported on 06/26/2019)   [DISCONTINUED] norgestimate-ethinyl estradiol (ORTHO-CYCLEN) 0.25-35 MG-MCG tablet Take 1 tablet by mouth daily. (Patient not taking: Reported on 02/02/2021)   [DISCONTINUED] omeprazole (PRILOSEC) 20 MG capsule Take 20 mg by mouth every morning.   No facility-administered encounter medications on file as of 03/13/2021.   40 minutes spent obtaining history and physical extra time needed because of language barrier performing high complexity of decision making and counseling around breast implants silicone rupture Follow-up: Return in about 3 months (around 06/10/2021).   Asencion Noble, MD

## 2021-03-13 NOTE — Assessment & Plan Note (Signed)
Patient with chronic pelvic pain but not severe enough to require hysterectomy we will monitor

## 2021-03-13 NOTE — Assessment & Plan Note (Signed)
We will obtain full screening labs including HIV hep C

## 2021-03-13 NOTE — Assessment & Plan Note (Signed)
Issue fecal occult kit for screenings

## 2021-03-13 NOTE — Assessment & Plan Note (Signed)
Based on symptoms and recent imaging the patient likely has leakage of the left silicone implant  We will need to investigate this further the best study is an MRI however she needs to get [facility] discount and orange card before we can schedule this due to cost  Plan is to get the orange card application performed ASAP and then we will get her scheduled with an MRI

## 2021-03-14 ENCOUNTER — Ambulatory Visit: Payer: Self-pay | Attending: Critical Care Medicine

## 2021-03-14 DIAGNOSIS — Z139 Encounter for screening, unspecified: Secondary | ICD-10-CM

## 2021-03-14 DIAGNOSIS — N921 Excessive and frequent menstruation with irregular cycle: Secondary | ICD-10-CM

## 2021-03-14 DIAGNOSIS — Z1159 Encounter for screening for other viral diseases: Secondary | ICD-10-CM

## 2021-03-14 DIAGNOSIS — Z114 Encounter for screening for human immunodeficiency virus [HIV]: Secondary | ICD-10-CM

## 2021-03-15 ENCOUNTER — Telehealth: Payer: Self-pay

## 2021-03-15 LAB — CBC WITH DIFFERENTIAL/PLATELET
Basophils Absolute: 0 10*3/uL (ref 0.0–0.2)
Basos: 1 %
EOS (ABSOLUTE): 0.1 10*3/uL (ref 0.0–0.4)
Eos: 1 %
Hematocrit: 37.2 % (ref 34.0–46.6)
Hemoglobin: 12.6 g/dL (ref 11.1–15.9)
Immature Grans (Abs): 0 10*3/uL (ref 0.0–0.1)
Immature Granulocytes: 0 %
Lymphocytes Absolute: 1.3 10*3/uL (ref 0.7–3.1)
Lymphs: 29 %
MCH: 30.7 pg (ref 26.6–33.0)
MCHC: 33.9 g/dL (ref 31.5–35.7)
MCV: 91 fL (ref 79–97)
Monocytes Absolute: 0.3 10*3/uL (ref 0.1–0.9)
Monocytes: 8 %
Neutrophils Absolute: 2.7 10*3/uL (ref 1.4–7.0)
Neutrophils: 61 %
Platelets: 214 10*3/uL (ref 150–450)
RBC: 4.1 x10E6/uL (ref 3.77–5.28)
RDW: 12.5 % (ref 11.7–15.4)
WBC: 4.4 10*3/uL (ref 3.4–10.8)

## 2021-03-15 LAB — LIPID PANEL
Chol/HDL Ratio: 2.7 ratio (ref 0.0–4.4)
Cholesterol, Total: 212 mg/dL — ABNORMAL HIGH (ref 100–199)
HDL: 78 mg/dL (ref >39)
LDL Chol Calc (NIH): 119 mg/dL — ABNORMAL HIGH (ref 0–99)
Triglycerides: 83 mg/dL (ref 0–149)
VLDL Cholesterol Cal: 15 mg/dL (ref 5–40)

## 2021-03-15 LAB — HEMOGLOBIN A1C
Est. average glucose Bld gHb Est-mCnc: 111 mg/dL
Hgb A1c MFr Bld: 5.5 % (ref 4.8–5.6)

## 2021-03-15 LAB — COMPREHENSIVE METABOLIC PANEL
ALT: 18 IU/L (ref 0–32)
AST: 25 IU/L (ref 0–40)
Albumin/Globulin Ratio: 2.6 — ABNORMAL HIGH (ref 1.2–2.2)
Albumin: 4.9 g/dL — ABNORMAL HIGH (ref 3.8–4.8)
Alkaline Phosphatase: 60 IU/L (ref 44–121)
BUN/Creatinine Ratio: 16 (ref 9–23)
BUN: 11 mg/dL (ref 6–24)
Bilirubin Total: 0.4 mg/dL (ref 0.0–1.2)
CO2: 23 mmol/L (ref 20–29)
Calcium: 9.2 mg/dL (ref 8.7–10.2)
Chloride: 104 mmol/L (ref 96–106)
Creatinine, Ser: 0.69 mg/dL (ref 0.57–1.00)
Globulin, Total: 1.9 g/dL (ref 1.5–4.5)
Glucose: 90 mg/dL (ref 70–99)
Potassium: 4.2 mmol/L (ref 3.5–5.2)
Sodium: 139 mmol/L (ref 134–144)
Total Protein: 6.8 g/dL (ref 6.0–8.5)
eGFR: 106 mL/min/{1.73_m2} (ref >59)

## 2021-03-15 LAB — HCV INTERPRETATION

## 2021-03-15 LAB — HIV ANTIBODY (ROUTINE TESTING W REFLEX): HIV Screen 4th Generation wRfx: NONREACTIVE

## 2021-03-15 LAB — HCV AB W REFLEX TO QUANT PCR: HCV Ab: NONREACTIVE

## 2021-03-15 NOTE — Telephone Encounter (Signed)
Pt was called and is aware of results, DOB was confirmed.  Interpreter [id number]

## 2021-03-15 NOTE — Telephone Encounter (Signed)
-----   Message from Elsie Stain, MD sent at 03/15/2021  6:30 AM EST ----- Let pt know cholesterol very high recommend she take a fish oil omega 3 capsule daily and follow low fat diet meaning eat meats low in fat like chicken [place] salmon tuna      minimize red meats to once to twice a week  Kidney liver normal Hiv and Hep C NEG Blood counts normal No diabetes

## 2021-03-20 ENCOUNTER — Ambulatory Visit: Payer: No Typology Code available for payment source

## 2021-03-24 ENCOUNTER — Ambulatory Visit: Payer: Self-pay | Attending: Critical Care Medicine

## 2021-03-24 ENCOUNTER — Telehealth: Payer: Self-pay | Admitting: Critical Care Medicine

## 2021-03-24 ENCOUNTER — Other Ambulatory Visit: Payer: Self-pay

## 2021-03-24 DIAGNOSIS — G8929 Other chronic pain: Secondary | ICD-10-CM

## 2021-03-24 DIAGNOSIS — K089 Disorder of teeth and supporting structures, unspecified: Secondary | ICD-10-CM

## 2021-03-24 NOTE — Telephone Encounter (Signed)
Pt came to the office to request a referral for a dentist, Pt has the orange card program, please follow up ?

## 2021-03-24 NOTE — Telephone Encounter (Signed)
Fyi.

## 2021-03-24 NOTE — Telephone Encounter (Signed)
Orange card dentist referral placed ?

## 2021-03-27 NOTE — Telephone Encounter (Signed)
Pt is aware of notes ? ?Interpreter ZS#[id number] ?

## 2021-07-03 NOTE — Progress Notes (Signed)
Meals  New Patient Office Visit  Subjective:  Patient ID: Ruth Perkins, female    DOB: 12-May-1970  Age: 51 y.o. MRN: [id number]  CC:  No chief complaint on file.   HPI 03/13/21 Edward Jolly Aguilera presents for pcp to re establish This visit was assisted by Spanish video interpreter Aram Beecham [phone] Patient is a former primary care patient of Dr. Chapman Fitch not seen since early of 2021 in this clinic.  Patient has had screening mammogram through the breast cancer scholarship program.  She had previously had bilateral silicone implants placed 16 years ago in [place].  mammograms done.  There are small cysts in the right breast that are being followed however she may have leakage of the silicone in the left breast.  She is noted for the past several months that she has had pain in the left breast.  She also has incidentally left sided chest wall and upper arm shoulder and neck pain as well which is not associated to the chest pain from the breast.  She is interested in having the silicone implants removed if this needs to be the case.  She does not have any insurance at this time.  She did have the orange card but this is expired Another issue is this patient had previously developed fibroid uterus and had dysmenorrhea.  As she is approaching menopause the amount of blood now is less.  She took birth control pills for a while but has a bleeding decrease in her periods became less regular she stopped the birth control pills.  She is using a watchful waiting pattern at this time.  She does need repeat screening labs. The patient declined to receive tetanus shot and flu shot  She is agreeable to receiving colon cancer screening kit  6/13 This is a 51 year old female who I saw previously in February for her initial encounter.  The patient just achieved her orange card in March and at the last visit we were waiting on this we could get an MRI and potentially send her to plastic surgery for silicone  breast implant removal.  The patient did achieve the orange card and when she called the clinic she had complaints of dental pain and monitor referral to dentistry but somehow the information regarding the breast implant was not conveyed and the patient was told she would need another appointment to sort that out.  I was not given that information all I was given was that she needed a dental referral.  On arrival today she continues to have pain in the left breast area  Past Medical History:  Diagnosis Date   Uterine myoma     Past Surgical History:  Procedure Laterality Date   CESAREAN SECTION     TUBAL LIGATION      Family History  Problem Relation Age of Onset   Uterine cancer Mother     Social History   Socioeconomic History   Marital status: Divorced    Spouse name: Not on file   Number of children: 3   Years of education: Not on file   Highest education level: High school graduate  Occupational History   Not on file  Tobacco Use   Smoking status: Never   Smokeless tobacco: Never  Vaping Use   Vaping Use: Never used  Substance and Sexual Activity   Alcohol use: Never   Drug use: Never   Sexual activity: Yes    Birth control/protection: Surgical  Other Topics Concern   Not  on file  Social History Narrative   Not on file   Social Determinants of Health   Financial Resource Strain: Not on file  Food Insecurity: No Food Insecurity (02/02/2021)   Hunger Vital Sign    Worried About Running Out of Food in the Last Year: Never true    Ran Out of Food in the Last Year: Never true  Transportation Needs: No Transportation Needs (02/02/2021)   PRAPARE - Hydrologist (Medical): No    Lack of Transportation (Non-Medical): No  Physical Activity: Not on file  Stress: Not on file  Social Connections: Not on file  Intimate Partner Violence: Not on file    ROS Review of Systems  Constitutional:  Negative for chills, diaphoresis and fever.   HENT:  Negative for congestion, hearing loss, nosebleeds, sore throat and tinnitus.   Eyes:  Negative for photophobia and redness.  Respiratory:  Negative for cough, shortness of breath, wheezing and stridor.   Cardiovascular:  Positive for chest pain. Negative for palpitations and leg swelling.  Gastrointestinal:  Negative for abdominal pain, blood in stool, constipation, diarrhea, nausea and vomiting.  Endocrine: Negative for polydipsia.  Genitourinary:  Positive for pelvic pain. Negative for dysuria, flank pain, frequency, hematuria and urgency.       Frequent periods   excess bleeding  Musculoskeletal:  Positive for back pain and neck pain. Negative for myalgias.       Left breast pain  Skin:  Negative for rash.  Allergic/Immunologic: Negative for environmental allergies.  Neurological:  Negative for dizziness, tremors, seizures, weakness and headaches.  Hematological:  Does not bruise/bleed easily.  Psychiatric/Behavioral:  Negative for suicidal ideas. The patient is not nervous/anxious.     Objective:   Today's Vitals: BP 121/75   Pulse 74   Wt 146 lb 12.8 oz (66.6 kg)   SpO2 99%   BMI 23.69 kg/m   Physical Exam Vitals reviewed.  Constitutional:      Appearance: Normal appearance. She is well-developed. She is not diaphoretic.  HENT:     Head: Normocephalic and atraumatic.     Nose: Nose normal. No nasal deformity, septal deviation, mucosal edema or rhinorrhea.     Right Sinus: No maxillary sinus tenderness or frontal sinus tenderness.     Left Sinus: No maxillary sinus tenderness or frontal sinus tenderness.     Mouth/Throat:     Mouth: Mucous membranes are moist.     Pharynx: Oropharynx is clear. No oropharyngeal exudate.  Eyes:     General: No scleral icterus.    Conjunctiva/sclera: Conjunctivae normal.     Pupils: Pupils are equal, round, and reactive to light.  Neck:     Thyroid: No thyromegaly.     Vascular: No carotid bruit or JVD.     Trachea: Trachea  normal. No tracheal tenderness or tracheal deviation.  Cardiovascular:     Rate and Rhythm: Normal rate and regular rhythm.     Chest Wall: PMI is not displaced.     Pulses: Normal pulses. No decreased pulses.     Heart sounds: Normal heart sounds, S1 normal and S2 normal. Heart sounds not distant. No murmur heard.    No systolic murmur is present.     No diastolic murmur is present.     No friction rub. No gallop. No S3 or S4 sounds.  Pulmonary:     Effort: Pulmonary effort is normal. No tachypnea, accessory muscle usage or respiratory distress.  Breath sounds: Normal breath sounds. No stridor. No decreased breath sounds, wheezing, rhonchi or rales.  Chest:     Chest wall: No tenderness.     Comments: There are bilateral breast silicone implants palpable however the one on the left appears to be more firm and is more tender at 6:00 but difficult to tell if there is a significant amount of silicone leakage based on the physical exam  This exam done with chaperone Carly my CMA Abdominal:     General: Bowel sounds are normal. There is no distension.     Palpations: Abdomen is soft. Abdomen is not rigid.     Tenderness: There is no abdominal tenderness. There is no guarding or rebound.  Musculoskeletal:        General: Normal range of motion.     Cervical back: Normal range of motion and neck supple. No edema, erythema or rigidity. No muscular tenderness. Normal range of motion.  Lymphadenopathy:     Head:     Right side of head: No submental or submandibular adenopathy.     Left side of head: No submental or submandibular adenopathy.     Cervical: No cervical adenopathy.  Skin:    General: Skin is warm and dry.     Coloration: Skin is not pale.     Findings: No rash.     Nails: There is no clubbing.  Neurological:     Mental Status: She is alert and oriented to person, place, and time.     Sensory: No sensory deficit.  Psychiatric:        Speech: Speech normal.         Behavior: Behavior normal.      Breast Mammogram 03/07/21 FINDINGS: Spot compression tomosynthesis images through the upper outer right breast demonstrates a lobulated obscured mass measuring approximately 1.1 cm. The patient has retropectoral implants.   Ultrasound targeted to the right breast at 11 o'clock, 2 cm from the nipple demonstrates a cluster of anechoic and hypoechoic masses spanning 1.0 x 0.4 x 0.3 cm. A portion of this is cystic while other portions are hypoechoic, and favored to represent complicated cysts.   IMPRESSION: 1. There is a likely benign 1.0 cm cluster of cysts in the right breast at 11 o'clock.   2. Upon review of the patient's screening mammogram, appears she has extracapsular silicone along the medial posterior aspect of the capsule suggestive of extracapsular rupture.   RECOMMENDATION: 1. Six-month follow-up right breast ultrasound is recommended for the likely benign mass at 11 o'clock.   2. I explained the possible silicone rupture in the medial left breast, and that MRI with a implant rupture protocol is the gold standard for evaluating silicone implant rupture. Assessment & Plan:   Problem List Items Addressed This Visit       Other   Ruptured silicone breast implant - Primary    Imaging and exam compatible with ruptured breast implants unfortunately this patient achieved her orange card in March of this year but I was not made aware of this.  Therefore a referral to plastic surgery was not achieved.  I will send an urgent referral to plastic surgery at this time given that she would like to have both implants removed and she does have a [employer] discount now we will hold off on MRI and to see if plastic surgery needs      Relevant Orders   Ambulatory referral to Plastic Surgery   Colon cancer screening  Patient forgot to complete fecal occult kit she will process this now and turned in      Outpatient Encounter Medications as of  07/04/2021  Medication Sig   cholecalciferol (VITAMIN D3) 25 MCG (1000 UNIT) tablet Take 1,000 Units by mouth daily.   No facility-administered encounter medications on file as of 07/04/2021.   Follow-up: Return in about 4 months (around 11/03/2021).   Asencion Noble, MD

## 2021-07-04 ENCOUNTER — Encounter: Payer: Self-pay | Admitting: Critical Care Medicine

## 2021-07-04 ENCOUNTER — Ambulatory Visit: Payer: Self-pay | Attending: Critical Care Medicine | Admitting: Critical Care Medicine

## 2021-07-04 VITALS — BP 121/75 | HR 74 | Wt 146.8 lb

## 2021-07-04 DIAGNOSIS — T8543XD Leakage of breast prosthesis and implant, subsequent encounter: Secondary | ICD-10-CM

## 2021-07-04 DIAGNOSIS — Z1211 Encounter for screening for malignant neoplasm of colon: Secondary | ICD-10-CM

## 2021-07-04 NOTE — Patient Instructions (Addendum)
Referral to plastic surgery was made Dr Audelia Hives for consideration of removal of silicone breast implants Her office number is [phone]  be on look out for this number if they call you  Process fecal occult colon cancer screen kit given today and return to lab   Return Dr Joya Gaskins 4 months    La Dra. Claire Dillingham refiri a la ciruga plstica para Midwife extraccin de los implantes mamarios de silicona. El nmero de su oficina es [phone]. Est atento a este nmero si lo llaman.  Procesar el kit de deteccin de cncer de colon oculto en heces entregado hoy y devolverlo al laboratorio  Volver Dr. Joya Gaskins 4 meses

## 2021-07-04 NOTE — Assessment & Plan Note (Signed)
Imaging and exam compatible with ruptured breast implants unfortunately this patient achieved her orange card in March of this year but I was not made aware of this.  Therefore a referral to plastic surgery was not achieved.  I will send an urgent referral to plastic surgery at this time given that she would like to have both implants removed and she does have a [employer] discount now we will hold off on MRI and to see if plastic surgery needs

## 2021-07-04 NOTE — Progress Notes (Signed)
DOB verified

## 2021-07-04 NOTE — Assessment & Plan Note (Signed)
Patient forgot to complete fecal occult kit she will process this now and turned in

## 2021-07-07 ENCOUNTER — Ambulatory Visit (INDEPENDENT_AMBULATORY_CARE_PROVIDER_SITE_OTHER): Payer: Self-pay | Admitting: Plastic Surgery

## 2021-07-07 ENCOUNTER — Encounter: Payer: Self-pay | Admitting: Plastic Surgery

## 2021-07-07 VITALS — BP 125/80 | HR 69 | Wt 145.6 lb

## 2021-07-07 DIAGNOSIS — T8543XA Leakage of breast prosthesis and implant, initial encounter: Secondary | ICD-10-CM

## 2021-07-07 NOTE — Progress Notes (Signed)
Patient ID: Ruth Perkins, female    DOB: 1970-08-29, 51 y.o.   MRN: [id number]   Chief Complaint  Patient presents with   Consult   Breast Problem    The patient is a 51 year old female here for evaluation of her breasts.  She underwent breast implant placement 16 years ago in [place].  She believes that they are textured silicone implants but not sure of the size.  It might be a 325 cc implant.  She went from an 8 To around a BC cup.  She is not sure if they are under the muscle or above the muscle.  She is not a smoker, not on blood thinners and does not have diabetes.  The implants were placed through an inframammary incision.  She had a mammogram in February 2023 that was suspicious for rupture.  The sternal notch to nipple distance on the left is 23 cm and 23 cm on the right.  The mammogram also showed that the implants were prepectoral.  It also showed the possible silicone rupture in the medial left breast.  The patient thinks that the implants may be the cause of cloudy thinking, hair loss and discomfort.    Review of Systems  Constitutional: Negative.   HENT: Negative.    Eyes: Negative.   Respiratory: Negative.  Negative for chest tightness and shortness of breath.   Cardiovascular: Negative.   Gastrointestinal: Negative.   Endocrine: Negative.   Genitourinary: Negative.   Musculoskeletal: Negative.   Skin: Negative.   Psychiatric/Behavioral: Negative.      Past Medical History:  Diagnosis Date   Uterine myoma     Past Surgical History:  Procedure Laterality Date   CESAREAN SECTION     TUBAL LIGATION        Current Outpatient Medications:    cholecalciferol (VITAMIN D3) 25 MCG (1000 UNIT) tablet, Take 1,000 Units by mouth daily., Disp: , Rfl:    Objective:   Vitals:   07/07/21 1013  BP: 125/80  Pulse: 69  SpO2: 97%    Physical Exam Vitals and nursing note reviewed.  Constitutional:      Appearance: Normal appearance.  HENT:     Head:  Normocephalic and atraumatic.  Cardiovascular:     Rate and Rhythm: Normal rate.     Pulses: Normal pulses.  Pulmonary:     Effort: Pulmonary effort is normal.  Abdominal:     Palpations: Abdomen is soft.  Musculoskeletal:        General: No swelling or deformity.  Skin:    General: Skin is warm.     Capillary Refill: Capillary refill takes less than 2 seconds.     Coloration: Skin is not jaundiced.     Findings: No bruising.  Neurological:     Mental Status: She is alert and oriented to person, place, and time.  Psychiatric:        Mood and Affect: Mood normal.        Behavior: Behavior normal.        Thought Content: Thought content normal.        Judgment: Judgment normal.     Assessment & Plan:  Ruptured silicone breast implant, initial encounter  With the patient's desire to go ahead and have the implants removed and no additional surgery it seems reasonable not to proceed with MRI.  Patient is a good candidate for bilateral implant removal as well with complete capsulectomy.  She does not want to do a  mastopexy right now and I think that is reasonable.  Pictures were obtained of the patient and placed in the chart with the patient's or guardian's permission.   [place], DO

## 2021-07-28 ENCOUNTER — Telehealth: Payer: Self-pay | Admitting: Plastic Surgery

## 2021-07-28 NOTE — Telephone Encounter (Signed)
Lvm using interpreter services to find out if patient has health insurance so we can authorize her surgery. If she is self pay, we can discuss a date.

## 2021-08-18 ENCOUNTER — Encounter: Payer: Self-pay | Admitting: Plastic Surgery

## 2021-08-23 NOTE — Telephone Encounter (Signed)
Patient is calling back in about message below. In regards to Stephanie's message on 7/7 - Kensly does not have any insurance but does have [employer] for [employer]. Advised someone would reach out as soon as they can.

## 2021-08-24 NOTE — Telephone Encounter (Signed)
Outbound call placed to follow up with patient about sx for ruptured silicone breast implant. Made patient aware we don't accept her orange card as payment and that she would be a self-pay patient if she decided to move forward with the procedure. Patient would like a self-pay quote sent to her via [place].

## 2021-08-28 ENCOUNTER — Encounter: Payer: Self-pay | Admitting: *Deleted

## 2021-08-29 ENCOUNTER — Telehealth: Payer: Self-pay | Admitting: *Deleted

## 2021-08-29 NOTE — Telephone Encounter (Signed)
Called patient via interpreter services @ [phone] (Spanish) to schedule surgery. Patient states she wants to speak with [facility] Financial Aide first and will call us back when she is ready to schedule.

## 2021-09-05 ENCOUNTER — Other Ambulatory Visit: Payer: Self-pay | Admitting: Obstetrics and Gynecology

## 2021-09-05 ENCOUNTER — Ambulatory Visit
Admission: RE | Admit: 2021-09-05 | Discharge: 2021-09-05 | Disposition: A | Discharge status: Home / Self Care | Payer: No Typology Code available for payment source | Source: Ambulatory Visit | Attending: Obstetrics and Gynecology | Admitting: Obstetrics and Gynecology

## 2021-09-05 DIAGNOSIS — N6310 Unspecified lump in the right breast, unspecified quadrant: Secondary | ICD-10-CM

## 2021-09-07 ENCOUNTER — Telehealth: Payer: Self-pay | Admitting: Plastic Surgery

## 2021-09-07 NOTE — Telephone Encounter (Signed)
Pt arrived and needed an interpreter to disuss if financial assistance could be used for her procedure.  She spoke with the office manager and she will double check for her with the physician but a My Chart message was sent to her previously stating that financial assistane would not handle those charges.

## 2021-10-10 ENCOUNTER — Ambulatory Visit (INDEPENDENT_AMBULATORY_CARE_PROVIDER_SITE_OTHER): Payer: Self-pay | Admitting: Plastic Surgery

## 2021-10-10 ENCOUNTER — Encounter: Payer: Self-pay | Admitting: Plastic Surgery

## 2021-10-10 DIAGNOSIS — T8543XA Leakage of breast prosthesis and implant, initial encounter: Secondary | ICD-10-CM

## 2021-10-10 NOTE — Progress Notes (Signed)
The patient is here with the interpreter for further discussion about breast surgery.  She had implants placed in [place] over 10 years ago.  One of them is leaking and she is concerned that it is causing some issues.  She would like to have them removed.  She is thinking about a mastopexy and originally she said now but now she is thinking about including a mastopexy.  Pictures are already in the chart.

## 2021-12-07 ENCOUNTER — Other Ambulatory Visit: Payer: Self-pay

## 2021-12-07 DIAGNOSIS — N6310 Unspecified lump in the right breast, unspecified quadrant: Secondary | ICD-10-CM

## 2022-02-21 ENCOUNTER — Telehealth: Payer: Self-pay | Admitting: *Deleted

## 2022-02-22 IMAGING — MG MM DIGITAL DIAGNOSTIC UNILAT*R* IMPLANT W/ TOMO W/ CAD
4 series · 4 of 12 positions shown · non-contrast
Comparison: Previous exam(s).

CLINICAL DATA: Screening recall for a right breast asymmetry.

EXAM:
DIGITAL DIAGNOSTIC UNILATERAL RIGHT MAMMOGRAM WITH IMPLANTS, CAD AND
TOMOSYNTHESIS; ULTRASOUND RIGHT BREAST LIMITED
TECHNIQUE: Right digital diagnostic mammography and breast tomosynthesis was
performed. The images were evaluated with computer-aided detection.
Standard and/or implant displaced views were performed.; Targeted
ultrasound examination of the right breast was performed

[R CC synth-2D]
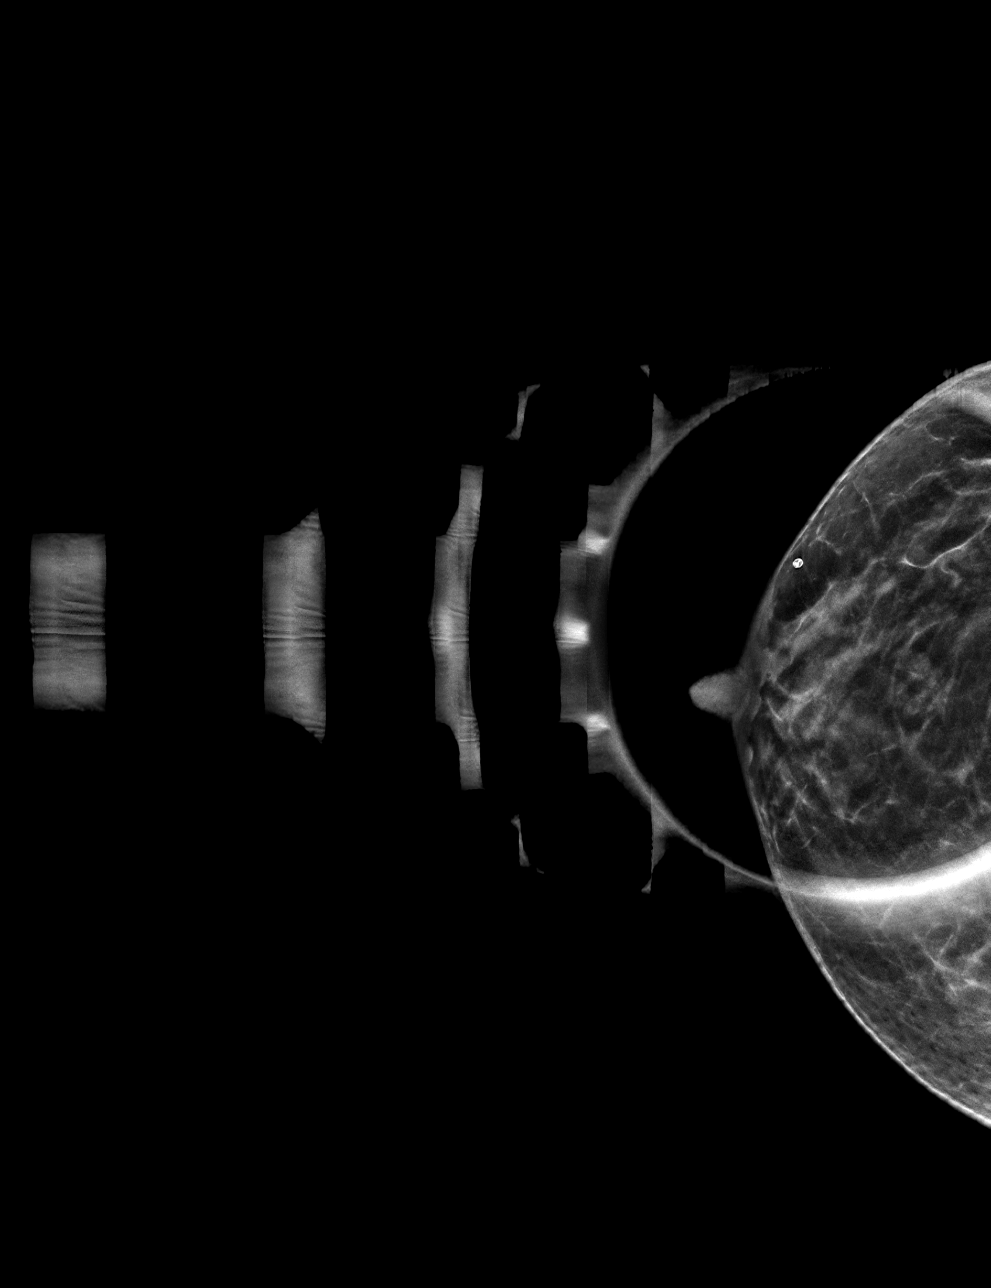

[R MLO synth-2D]
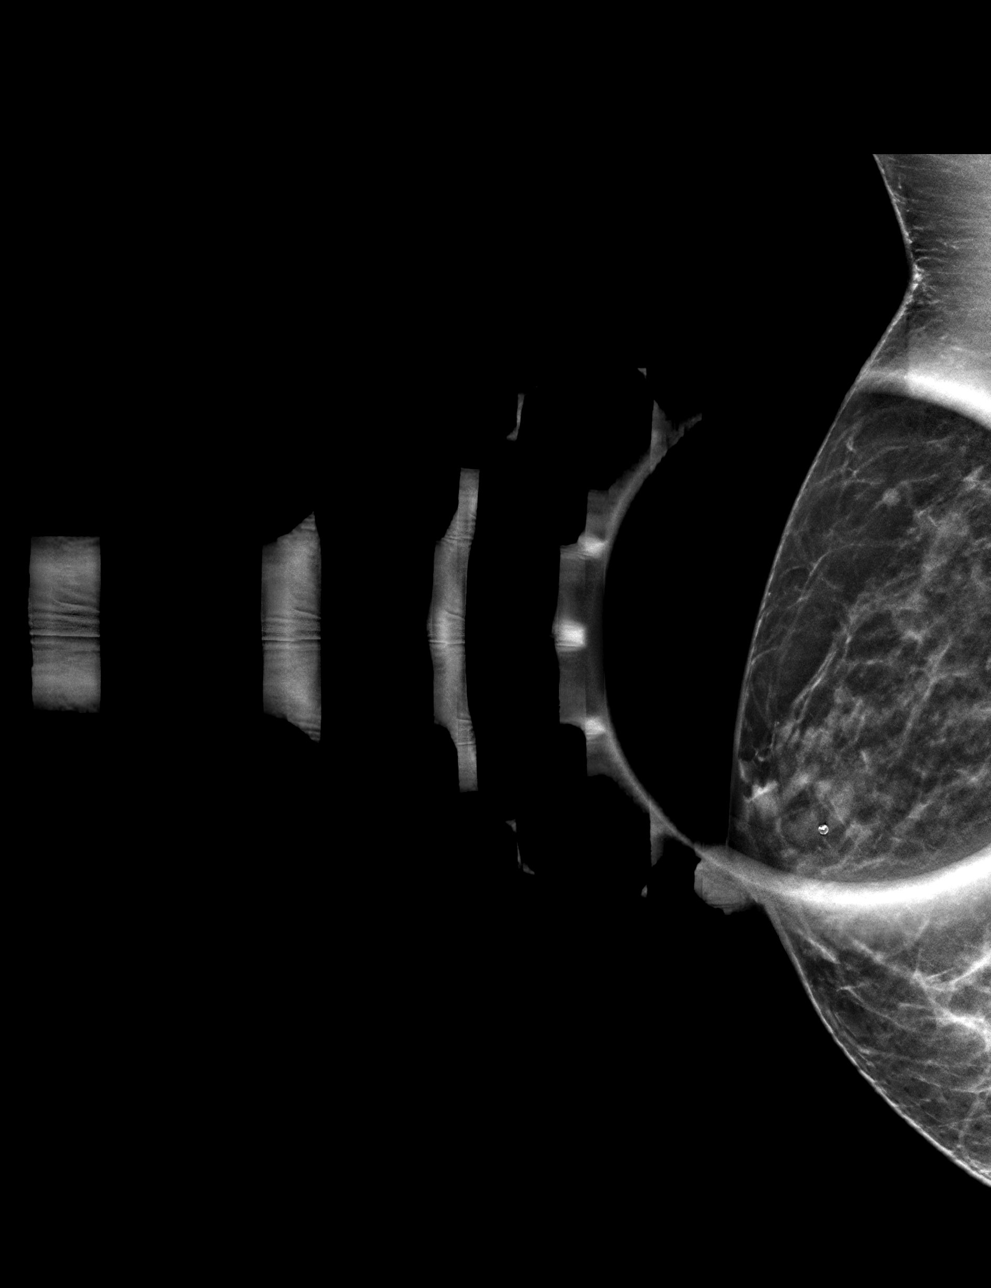

[R CCID BREAST TOMOSYNTHESIS IMAGE tomo · tomo slice 21/40.0]
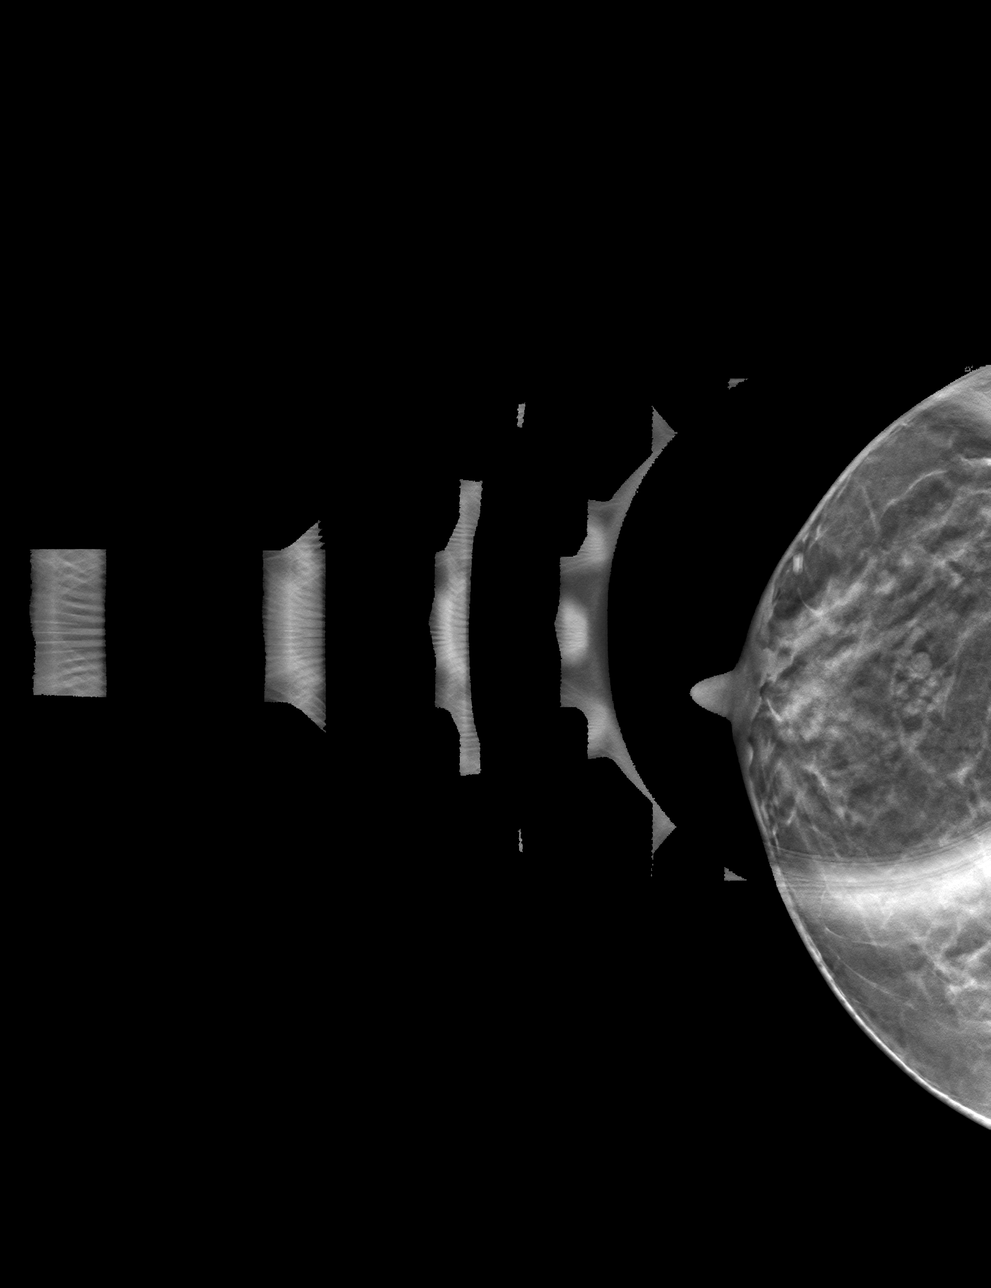

[R MLOID BREAST TOMOSYNTHESIS IMAGE tomo · tomo slice 21/41.0]
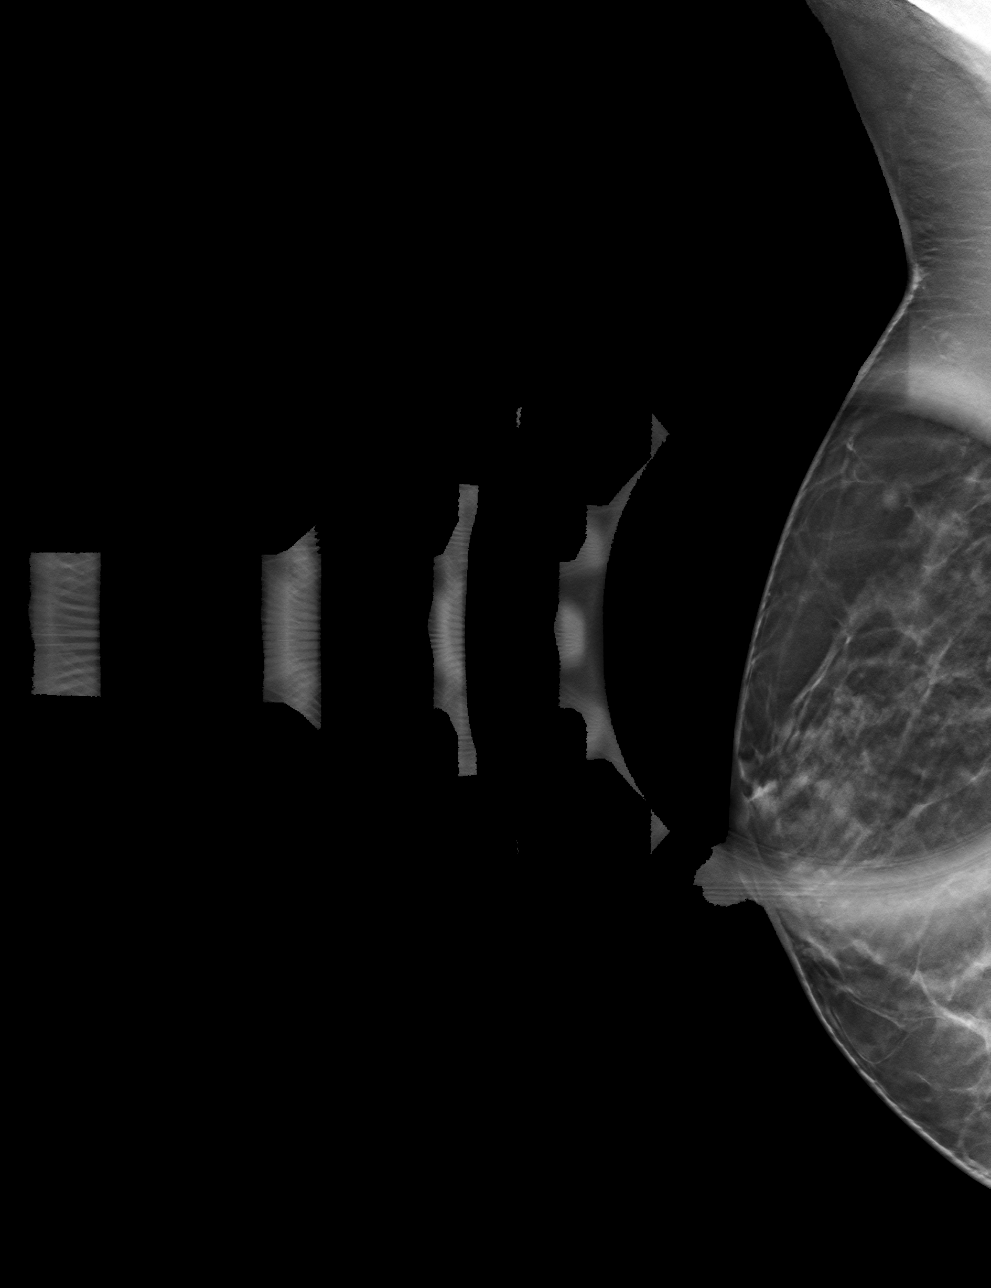

[4 of 12 positions shown; findings below may reference images not displayed]

ACR Breast Density Category c: The breast tissue is heterogeneously
dense, which may obscure small masses.
FINDINGS: Spot compression tomosynthesis images through the upper outer right
breast demonstrates a lobulated obscured mass measuring
approximately 1.1 cm. The patient has retropectoral implants.

Ultrasound targeted to the right breast at 11 o'clock, 2 cm from the
nipple demonstrates a cluster of anechoic and hypoechoic masses
spanning 1.0 x 0.4 x 0.3 cm. A portion of this is cystic while other
portions are hypoechoic, and favored to represent complicated cysts.
IMPRESSION: 1. There is a likely benign 1.0 cm cluster of cysts in the right
breast at 11 o'clock.

2. Upon review of the patient's screening mammogram, appears she has
extracapsular silicone along the medial posterior aspect of the
capsule suggestive of extracapsular rupture.

RECOMMENDATION:
1. Six-month follow-up right breast ultrasound is recommended for
the likely benign mass at 11 o'clock.

2. I explained the possible silicone rupture in the medial left
breast, and that MRI with a implant rupture protocol is the gold
standard for evaluating silicone implant rupture.

I have discussed the findings and recommendations with the patient.
If applicable, a reminder letter will be sent to the patient
regarding the next appointment.

BI-RADS CATEGORY  3: Probably benign.

## 2022-04-19 ENCOUNTER — Other Ambulatory Visit: Payer: Self-pay | Admitting: Obstetrics and Gynecology

## 2022-04-19 ENCOUNTER — Ambulatory Visit
Admission: RE | Admit: 2022-04-19 | Discharge: 2022-04-19 | Disposition: A | Discharge status: Home / Self Care | Payer: Self-pay | Source: Ambulatory Visit | Attending: Obstetrics and Gynecology | Admitting: Obstetrics and Gynecology

## 2022-04-19 ENCOUNTER — Ambulatory Visit: Payer: Self-pay | Admitting: Hematology and Oncology

## 2022-04-19 ENCOUNTER — Ambulatory Visit
Admission: RE | Admit: 2022-04-19 | Discharge: 2022-04-19 | Disposition: A | Discharge status: Home / Self Care | Payer: No Typology Code available for payment source | Source: Ambulatory Visit | Attending: Obstetrics and Gynecology | Admitting: Obstetrics and Gynecology

## 2022-04-19 VITALS — BP 123/80 | Wt 149.0 lb

## 2022-04-19 DIAGNOSIS — N6310 Unspecified lump in the right breast, unspecified quadrant: Secondary | ICD-10-CM

## 2022-04-19 DIAGNOSIS — Z01419 Encounter for gynecological examination (general) (routine) without abnormal findings: Secondary | ICD-10-CM

## 2022-04-19 DIAGNOSIS — Z1211 Encounter for screening for malignant neoplasm of colon: Secondary | ICD-10-CM

## 2022-04-19 DIAGNOSIS — Z1231 Encounter for screening mammogram for malignant neoplasm of breast: Secondary | ICD-10-CM

## 2022-04-19 NOTE — Progress Notes (Signed)
Ruth Perkins is a 52 y.o. [phone] female who presents to [facility] clinic today with no complaints.    Pap Smear: Pap smear completed today. Last Pap smear was 2021 and was normal. Per patient has no history of an abnormal Pap smear. Last Pap smear result is available in Epic.   Physical exam: Breasts Breasts symmetrical. No skin abnormalities bilateral breasts. No nipple retraction bilateral breasts. No nipple discharge bilateral breasts. No lymphadenopathy. No lumps palpated bilateral breasts.  MS DIGITAL DIAG TOMO W/IMPLANTS UNI RIGHT  Result Date: 03/07/2021 CLINICAL DATA:  Screening recall for a right breast asymmetry. EXAM: DIGITAL DIAGNOSTIC UNILATERAL RIGHT MAMMOGRAM WITH IMPLANTS, CAD AND TOMOSYNTHESIS; ULTRASOUND RIGHT BREAST LIMITED TECHNIQUE: Right digital diagnostic mammography and breast tomosynthesis was performed. The images were evaluated with computer-aided detection. Standard and/or implant displaced views were performed.; Targeted ultrasound examination of the right breast was performed COMPARISON:  Previous exam(s). ACR Breast Density Category c: The breast tissue is heterogeneously dense, which may obscure small masses. FINDINGS: Spot compression tomosynthesis images through the upper outer right breast demonstrates a lobulated obscured mass measuring approximately 1.1 cm. The patient has retropectoral implants. Ultrasound targeted to the right breast at 11 o'clock, 2 cm from the nipple demonstrates a cluster of anechoic and hypoechoic masses spanning 1.0 x 0.4 x 0.3 cm. A portion of this is cystic while other portions are hypoechoic, and favored to represent complicated cysts. IMPRESSION: 1. There is a likely benign 1.0 cm cluster of cysts in the right breast at 11 o'clock. 2. Upon review of the patient's screening mammogram, appears she has extracapsular silicone along the medial posterior aspect of the capsule suggestive of extracapsular rupture. RECOMMENDATION: 1.  Six-month follow-up right breast ultrasound is recommended for the likely benign mass at 11 o'clock. 2. I explained the possible silicone rupture in the medial left breast, and that MRI with a implant rupture protocol is the gold standard for evaluating silicone implant rupture. I have discussed the findings and recommendations with the patient. If applicable, a reminder letter will be sent to the patient regarding the next appointment. BI-RADS CATEGORY  3: Probably benign. Electronically Signed   By: Ammie Ferrier M.D.   On: 03/07/2021 11:12  MS Digital Screening W/ Implants  Result Date: 02/03/2021 CLINICAL DATA:  Screening. EXAM: DIGITAL SCREENING BILATERAL MAMMOGRAM WITH IMPLANTS AND CAD TECHNIQUE: Bilateral screening digital craniocaudal and mediolateral oblique mammograms were obtained. The images were evaluated with computer-aided detection. Standard and/or implant displaced views were performed. COMPARISON:  Previous exam(s). ACR Breast Density Category c: The breast tissue is heterogeneously dense, which may obscure small masses. FINDINGS: The patient has prepectoral implants. In the right breast, a possible asymmetry warrants further evaluation. In the left breast, no suspicious masses or malignant type calcifications are identified. IMPRESSION: Further evaluation is suggested for possible asymmetry in the right breast. RECOMMENDATION: Diagnostic mammogram and possibly ultrasound of the right breast. (Code:[id number]-80M) The patient will be contacted regarding the findings, and additional imaging will be scheduled. BI-RADS CATEGORY  0: Incomplete. Need additional imaging evaluation and/or prior mammograms for comparison. Electronically Signed   By: Kristopher Oppenheim M.D.   On: 02/03/2021 10:26   MS Digital Screening W/ Implants  Result Date: 08/17/2019 CLINICAL DATA:  Screening. EXAM: DIGITAL SCREENING BILATERAL MAMMOGRAM WITH IMPLANTS AND CAD The patient has implants. Standard and implant displaced  views were performed. COMPARISON:  None. ACR Breast Density Category c: The breast tissue is heterogeneously dense, which may obscure small masses. FINDINGS: There are  no findings suspicious for malignancy. Images were processed with CAD. IMPRESSION: No mammographic evidence of malignancy. A result letter of this screening mammogram will be mailed directly to the patient. RECOMMENDATION: Screening mammogram in one year. (Code:[id number]) BI-RADS CATEGORY  1:  Negative. Electronically Signed   By: Kristopher Oppenheim M.D.   On: 08/17/2019 07:59         Pelvic/Bimanual Ext Genitalia No lesions, no swelling and no discharge observed on external genitalia.        Vagina Vagina pink and normal texture. No lesions or discharge observed in vagina.        Cervix Cervix is present. Cervix pink and of normal texture. No discharge observed.    Uterus Uterus is present and palpable. Uterus in normal position and normal size.        Adnexae Bilateral ovaries present and palpable. No tenderness on palpation.         Rectovaginal No rectal exam completed today since patient had no rectal complaints. No skin abnormalities observed on exam.     Smoking History: Patient has never smoked and was not referred to quit line.    Patient Navigation: Patient education provided. Access to services provided for patient through [place] interpreter provided. No transportation provided   Colorectal Cancer Screening: Per patient has never had colonoscopy completed No complaints today.    Breast and Cervical Cancer Risk Assessment: Patient does not have family history of breast cancer, known genetic mutations, or radiation treatment to the chest before age 31. Patient does not have history of cervical dysplasia, immunocompromised, or DES exposure in-utero.  Risk Assessment   No risk assessment data for the current encounter  Risk Scores       02/02/2021   Last edited by: Demetrius Revel, LPN    5-year risk: 0.7 %   Lifetime risk: 7.2 %            A: [facility] exam with pap smear Complaint of right breast burning. Known possible implant rupture. Follow up diagnostic imaging today.   P: Referred patient to the [facility] for a diagnostic mammogram. Appointment scheduled 04/19/22.  Melodye Ped, NP 04/19/2022 8:34 AM

## 2022-04-19 NOTE — Patient Instructions (Signed)
[place] about self breast awareness and gave educational materials to take home. Patient did not need a Pap smear today due to last Pap smear was in 2021 per patient. Let her know [facility] will cover Pap smears every 5 years unless has a history of abnormal Pap smears. Referred patient to the [facility] for diagnostic mammogram. Appointment scheduled for 04/18/22. Patient aware of appointment and will be there. Let patient know will follow up with her within the next couple weeks with results. Ruth Perkins verbalized understanding.  Melodye Ped, NP 9:02 AM

## 2022-04-23 LAB — CYTOLOGY - PAP
Adequacy: ABSENT
Comment: NEGATIVE
Diagnosis: NEGATIVE
High risk HPV: NEGATIVE

## 2022-05-17 LAB — FECAL OCCULT BLOOD, IMMUNOCHEMICAL: Fecal Occult Bld: NEGATIVE

## 2023-03-06 ENCOUNTER — Telehealth: Payer: Self-pay

## 2023-03-06 NOTE — Telephone Encounter (Signed)
Telephoned patient at mobile number using interpreter#[phone]. No answer and voice mail not an option at this time. [facility]

## 2023-03-12 ENCOUNTER — Other Ambulatory Visit: Payer: Self-pay

## 2023-03-12 DIAGNOSIS — N6311 Unspecified lump in the right breast, upper outer quadrant: Secondary | ICD-10-CM

## 2023-05-01 NOTE — Addendum Note (Signed)
 Addended by: Narda Rutherford on: 05/01/2023 01:57 PM   Modules accepted: Orders

## 2023-05-01 NOTE — Addendum Note (Signed)
 Addended by: Narda Rutherford on: 05/01/2023 01:55 PM   Modules accepted: Orders

## 2023-05-02 ENCOUNTER — Ambulatory Visit: Payer: Self-pay | Admitting: Hematology and Oncology

## 2023-05-02 ENCOUNTER — Other Ambulatory Visit: Payer: Self-pay

## 2023-05-02 ENCOUNTER — Ambulatory Visit: Payer: Self-pay

## 2023-05-02 ENCOUNTER — Other Ambulatory Visit: Payer: Self-pay | Admitting: Obstetrics and Gynecology

## 2023-05-02 ENCOUNTER — Ambulatory Visit
Admission: RE | Admit: 2023-05-02 | Discharge: 2023-05-02 | Disposition: A | Discharge status: Home / Self Care | Source: Ambulatory Visit | Attending: Obstetrics and Gynecology | Admitting: Obstetrics and Gynecology

## 2023-05-02 ENCOUNTER — Ambulatory Visit
Admission: RE | Admit: 2023-05-02 | Discharge: 2023-05-02 | Disposition: A | Discharge status: Home / Self Care | Payer: Self-pay | Source: Ambulatory Visit | Attending: Obstetrics and Gynecology | Admitting: Obstetrics and Gynecology

## 2023-05-02 VITALS — BP 131/77 | Wt 150.0 lb

## 2023-05-02 DIAGNOSIS — N6311 Unspecified lump in the right breast, upper outer quadrant: Secondary | ICD-10-CM

## 2023-05-02 DIAGNOSIS — Z1211 Encounter for screening for malignant neoplasm of colon: Secondary | ICD-10-CM

## 2023-05-02 DIAGNOSIS — N6310 Unspecified lump in the right breast, unspecified quadrant: Secondary | ICD-10-CM

## 2023-05-02 NOTE — Progress Notes (Signed)
 Ms. Ruth Perkins is a 53 y.o. female who presents to [facility] clinic today with no complaints. Follow up of probably benign right breast mass; possible extracapsular silicone along the medial contour of the left breast implant.   Pap Smear: Pap not smear completed today. Last Pap smear was 04/19/2022 and was normal. Per patient has no history of an abnormal Pap smear. Last Pap smear result is available in Epic.   Physical exam: Breasts Breasts symmetrical. No skin abnormalities bilateral breasts. No nipple retraction bilateral breasts. No nipple discharge bilateral breasts. No lymphadenopathy. No lumps palpated bilateral breasts.       Pelvic/Bimanual Pap is not indicated today    Smoking History: Patient has never smoked and was not referred to quit line.    Patient Navigation: Patient education provided. Access to services provided for patient through [facility] program. Ruth Perkins interpreter provided. No transportation provided   Colorectal Cancer Screening: Per patient has never had colonoscopy completed No complaints today.    Breast and Cervical Cancer Risk Assessment: Patient does not have family history of breast cancer, known genetic mutations, or radiation treatment to the chest before age 70. Patient does not have history of cervical dysplasia, immunocompromised, or DES exposure in-utero.  Risk Scores as of Encounter on 05/02/2023     Ruth Perkins as of 04/19/2022           5-year 1.07%   Lifetime 9.9%   This patient is [place] but has no documented birth country, so the [place] model used data from [place] patients to calculate their risk score. Document a birth country in the Demographics activity for a more accurate score.         Last calculated by Ruth Perkins, CMA on 04/19/2022 at  8:43 AM        A: [facility] exam without pap smear Follow up stable, probably benign right breast mass; possible extracapsular silicone along the medial contour of the left breast  implant. Will continue with diagnostic imaging today.   P: Referred patient to the [facility] for a diagnostic mammogram. Appointment scheduled 05/02/2023.  Ruth Lux, NP 05/02/2023 12:32 PM

## 2023-05-02 NOTE — Patient Instructions (Signed)
 Taught Ruth Perkins how to perform BSE and gave educational materials to take home. Patient did not need a Pap smear today due to last Pap smear was in 04/19/2022 per patient. Told patient about free cervical cancer screenings to receive a Pap smear if would like one next year. Let her know [facility] will cover Pap smears every 5 years unless has a history of abnormal Pap smears. Referred patient to the [facility] for diagnostic mammogram. Appointment scheduled for . Patient aware of appointment and will be there. Let patient know will follow up with her within the next couple weeks with results. Ruth Perkins verbalized understanding.  Pascal Lux, NP @T @ 12:34 PM

## 2023-05-18 LAB — FECAL OCCULT BLOOD, IMMUNOCHEMICAL: Fecal Occult Bld: NEGATIVE

## 2023-11-20 ENCOUNTER — Ambulatory Visit
